# Patient Record
Sex: Male | Born: 1955
Health system: Southern US, Community
[De-identification: ages and names within clinical notes are randomized; demographics above are authoritative.]

## PROBLEM LIST (undated history)

## (undated) DIAGNOSIS — T7840XA Allergy, unspecified, initial encounter: Secondary | ICD-10-CM

## (undated) DIAGNOSIS — G90519 Complex regional pain syndrome I of unspecified upper limb: Secondary | ICD-10-CM

## (undated) HISTORY — DX: Allergy, unspecified, initial encounter: T78.40XA

## (undated) HISTORY — DX: Complex regional pain syndrome I of unspecified upper limb: G90.519

---
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
## 1996-11-16 HISTORY — PX: KNEE SURGERY: SHX244

## 2001-11-16 HISTORY — PX: CARDIAC CATHETERIZATION: SHX172

## 2002-03-27 ENCOUNTER — Inpatient Hospital Stay (HOSPITAL_COMMUNITY): Admission: EM | Admit: 2002-03-27 | Discharge: 2002-03-28 | Payer: Self-pay | Admitting: Cardiology

## 2015-11-17 HISTORY — PX: SHOULDER ARTHROSCOPY WITH ROTATOR CUFF REPAIR: SHX5685

## 2016-04-24 ENCOUNTER — Other Ambulatory Visit (HOSPITAL_COMMUNITY): Payer: Self-pay | Admitting: Orthopedic Surgery

## 2016-04-24 DIAGNOSIS — G90511 Complex regional pain syndrome I of right upper limb: Secondary | ICD-10-CM

## 2016-04-30 ENCOUNTER — Encounter (HOSPITAL_COMMUNITY)
Admission: RE | Admit: 2016-04-30 | Discharge: 2016-04-30 | Disposition: A | Discharge status: Home / Self Care | Payer: BLUE CROSS/BLUE SHIELD | Source: Ambulatory Visit | Attending: Orthopedic Surgery | Admitting: Orthopedic Surgery

## 2016-04-30 DIAGNOSIS — G90511 Complex regional pain syndrome I of right upper limb: Secondary | ICD-10-CM | POA: Diagnosis present

## 2016-04-30 MED ORDER — TECHNETIUM TC 99M MEDRONATE IV KIT
26.5000 | PACK | Freq: Once | INTRAVENOUS | Status: AC | PRN
  Administered 2016-04-30: 26.5 via INTRAVENOUS

## 2017-05-19 IMAGING — NM NM BONE 3 PHASE
1 series · 6 of 6 positions shown · non-contrast
Comparison: None.

CLINICAL DATA: Complex regional pain syndrome

EXAM:
NUCLEAR MEDICINE 3-PHASE BONE SCAN
TECHNIQUE: Radionuclide angiographic images, immediate static blood pool
images, and 3-hour delayed static images were obtained of the upper
extremities after intravenous injection of radiopharmaceutical.
RADIOPHARMACEUTICALS:  26.5 mCi Rc-WWm MDP

[bf bone flow · 10.03mm/px · 6 of 48 frames shown]
[frame 5/48]
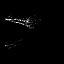
[frame 13/48]
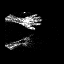
[frame 21/48  full-range]
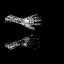
[frame 29/48  full-range]
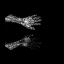
[frame 37/48  full-range]
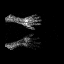
[frame 45/48  full-range]
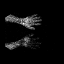

[6 of 6 positions shown; findings below may reference images not displayed]

FINDINGS: Vascular phase: Asymmetric increased flow to the right hand and
wrist identified.

Blood pool phase: Asymmetric increased blood pool activity to the
right hand and wrist noted.

Delayed phase: Asymmetric increased radiotracer uptake to the right
hand and wrist identified which follows a periarticular
distribution.
IMPRESSION: 1. There is abnormal asymmetric increased radiotracer uptake to the
right wrist and hand which on the delayed phase images exhibits a
periarticular distribution. Findings are compatible with reflex
sympathetic dystrophy to the right upper extremity.

## 2017-08-18 DIAGNOSIS — M792 Neuralgia and neuritis, unspecified: Secondary | ICD-10-CM | POA: Insufficient documentation

## 2017-08-18 DIAGNOSIS — Z9889 Other specified postprocedural states: Secondary | ICD-10-CM | POA: Insufficient documentation

## 2017-08-18 DIAGNOSIS — G5641 Causalgia of right upper limb: Secondary | ICD-10-CM | POA: Insufficient documentation

## 2017-08-23 DIAGNOSIS — R040 Epistaxis: Secondary | ICD-10-CM | POA: Insufficient documentation

## 2017-09-16 ENCOUNTER — Ambulatory Visit (INDEPENDENT_AMBULATORY_CARE_PROVIDER_SITE_OTHER): Payer: BLUE CROSS/BLUE SHIELD | Admitting: Otolaryngology

## 2017-09-16 DIAGNOSIS — R040 Epistaxis: Secondary | ICD-10-CM

## 2017-10-14 ENCOUNTER — Ambulatory Visit (INDEPENDENT_AMBULATORY_CARE_PROVIDER_SITE_OTHER): Payer: BLUE CROSS/BLUE SHIELD | Admitting: Otolaryngology

## 2017-10-14 DIAGNOSIS — H6121 Impacted cerumen, right ear: Secondary | ICD-10-CM | POA: Diagnosis not present

## 2017-10-14 DIAGNOSIS — R040 Epistaxis: Secondary | ICD-10-CM

## 2017-12-13 ENCOUNTER — Ambulatory Visit: Payer: Self-pay | Admitting: Family Medicine

## 2017-12-14 ENCOUNTER — Ambulatory Visit (INDEPENDENT_AMBULATORY_CARE_PROVIDER_SITE_OTHER): Payer: BLUE CROSS/BLUE SHIELD | Admitting: Family Medicine

## 2017-12-14 ENCOUNTER — Encounter: Payer: Self-pay | Admitting: Family Medicine

## 2017-12-14 VITALS — BP 114/64 | HR 71 | Ht 71.0 in | Wt 172.2 lb

## 2017-12-14 DIAGNOSIS — Z0000 Encounter for general adult medical examination without abnormal findings: Secondary | ICD-10-CM | POA: Diagnosis not present

## 2017-12-14 NOTE — Progress Notes (Signed)
Subjective:  Patient ID: Steven Cummings, male    DOB: 06/26/1956  Age: 62 y.o. MRN: [id number]  CC: New Patient (Initial Visit) (pt here today to establish care)   HPI Steven Cummings presents for complete physical examination.  He says he stays very active even though he has been retired for 4 years.  He works for 36 years with [employer].  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  He prefers to stay busy.  He never sits down.  His only medical concern is complex regional pain syndrome of the right upper extremity which has come down since taking amitriptyline.  He is under the care of the orthopedist who did his right shoulder arthroscopy to 3 years ago.  Depression screen PHQ 2/9 12/14/2017  Decreased Interest 0  Down, Depressed, Hopeless 0  PHQ - 2 Score 0    History Steven Cummings has a past medical history of Allergy and CRPS (complex regional pain syndrome), upper limb.   He has a past surgical history that includes Cardiac catheterization (2003); Tonsilectomy/adenoidectomy with myringotomy (1960); Knee surgery (Left, 1998); and Shoulder arthroscopy with rotator cuff repair (Right, 2017).   His family history includes Arthritis in his maternal grandfather, maternal grandmother, paternal grandfather, and paternal grandmother; Heart disease in his father and paternal grandfather; Hyperlipidemia in his father and mother; Hypertension in his father and mother; Kidney disease in his father; Stroke in his father.He reports that he quit smoking about 32 years ago. His smoking use included cigarettes. He has a 14.00 pack-year smoking history. he has never used smokeless tobacco. He reports that he drinks about 1.8 oz of alcohol per week. He reports that he does not use drugs.    ROS Review of Systems  Constitutional: Negative for activity change, appetite change, chills, diaphoresis, fatigue, fever and  unexpected weight change.  HENT: Negative for congestion, ear pain, hearing loss, postnasal drip, rhinorrhea, sore throat, tinnitus and trouble swallowing.   Eyes: Negative for photophobia, pain, discharge, redness and visual disturbance.  Respiratory: Negative for apnea, cough, choking, chest tightness, shortness of breath, wheezing and stridor.   Cardiovascular: Negative for chest pain, palpitations and leg swelling.  Gastrointestinal: Negative for abdominal distention, abdominal pain, blood in stool, constipation, diarrhea, nausea and vomiting.  Endocrine: Negative for cold intolerance, heat intolerance, polydipsia, polyphagia and polyuria.  Genitourinary: Negative for difficulty urinating, dysuria, enuresis, flank pain, frequency, genital sores, hematuria and urgency.  Musculoskeletal: Negative for arthralgias and joint swelling.  Skin: Negative for color change, rash and wound.  Allergic/Immunologic: Negative for immunocompromised state.  Neurological: Negative for dizziness, tremors, seizures, syncope, facial asymmetry, speech difficulty, weakness, light-headedness, numbness and headaches.  Hematological: Does not bruise/bleed easily.  Psychiatric/Behavioral: Negative for agitation, behavioral problems, confusion, decreased concentration, dysphoric mood, hallucinations, sleep disturbance and suicidal ideas. The patient is not nervous/anxious and is not hyperactive.     Objective:  BP 114/64   Pulse 71   Ht _0  (1.803 m)   Wt 172 lb 4 oz (78.1 kg)   BMI 24.02 kg/m   BP Readings from Last 3 Encounters:  12/14/17 114/64    Wt Readings from Last 3 Encounters:  12/14/17 172 lb 4 oz (78.1 kg)     Physical Exam  Constitutional: He is oriented to person, place, and time. He appears well-developed and well-nourished. No distress.  HENT:  Head: Normocephalic and atraumatic.  Right Ear: External ear normal.  Left  Ear: External ear normal.  Nose: Nose normal.  Mouth/Throat:  Oropharynx is clear and moist.  Eyes: Conjunctivae and EOM are normal. Pupils are equal, round, and reactive to light.  Neck: Normal range of motion. Neck supple. No tracheal deviation present. No thyromegaly present.  Cardiovascular: Normal rate, regular rhythm and normal heart sounds. Exam reveals no gallop and no friction rub.  No murmur heard. Pulmonary/Chest: Effort normal and breath sounds normal. No respiratory distress. He has no wheezes. He has no rales.  Abdominal: Soft. Bowel sounds are normal. He exhibits no distension and no mass. There is no tenderness.  Musculoskeletal: Normal range of motion. He exhibits no edema.  Lymphadenopathy:    He has no cervical adenopathy.  Neurological: He is alert and oriented to person, place, and time. He has normal reflexes.  Skin: Skin is warm and dry.  Psychiatric: He has a normal mood and affect. His behavior is normal. Judgment and thought content normal.      Assessment & Plan:   Chief was seen today for new patient (initial visit).  Diagnoses and all orders for this visit:  Well adult exam -     CBC with Differential/Platelet -     CMP14+EGFR -     Lipid panel -     PSA, total and free -     VITAMIN D 25 Hydroxy (Vit-D Deficiency, Fractures) -     Hepatitis C antibody -     HIV antibody       I am having Steven Cummings maintain his amitriptyline, multivitamin with minerals, and aspirin EC.  Allergies as of 12/14/2017      Reactions   Sulfa Antibiotics Rash      Medication List        Accurate as of 12/14/17  9:40 PM. Always use your most recent med list.          amitriptyline 25 MG tablet Commonly known as:  ELAVIL Take 25 mg by mouth at bedtime.   aspirin EC 81 MG tablet Take 81 mg by mouth.   multivitamin with minerals tablet Take by mouth.        Follow-up: No Follow-up on file.  Claretta Fraise, M.D.

## 2017-12-15 LAB — CBC WITH DIFFERENTIAL/PLATELET
BASOS ABS: 0 10*3/uL (ref 0.0–0.2)
Basos: 1 %
EOS (ABSOLUTE): 0.2 10*3/uL (ref 0.0–0.4)
EOS: 4 %
HEMATOCRIT: 40.9 % (ref 37.5–51.0)
HEMOGLOBIN: 13.9 g/dL (ref 13.0–17.7)
IMMATURE GRANULOCYTES: 0 %
Immature Grans (Abs): 0 10*3/uL (ref 0.0–0.1)
LYMPHS ABS: 1.6 10*3/uL (ref 0.7–3.1)
LYMPHS: 40 %
MCH: 31.3 pg (ref 26.6–33.0)
MCHC: 34 g/dL (ref 31.5–35.7)
MCV: 92 fL (ref 79–97)
Monocytes Absolute: 0.3 10*3/uL (ref 0.1–0.9)
Monocytes: 9 %
NEUTROS PCT: 46 %
Neutrophils Absolute: 1.9 10*3/uL (ref 1.4–7.0)
Platelets: 197 10*3/uL (ref 150–379)
RBC: 4.44 x10E6/uL (ref 4.14–5.80)
RDW: 13.2 % (ref 12.3–15.4)
WBC: 4 10*3/uL (ref 3.4–10.8)

## 2017-12-15 LAB — CMP14+EGFR
ALK PHOS: 112 IU/L (ref 39–117)
ALT: 13 IU/L (ref 0–44)
AST: 17 IU/L (ref 0–40)
Albumin/Globulin Ratio: 1.9 (ref 1.2–2.2)
Albumin: 4.7 g/dL (ref 3.6–4.8)
BUN/Creatinine Ratio: 17 (ref 10–24)
BUN: 14 mg/dL (ref 8–27)
Bilirubin Total: 0.7 mg/dL (ref 0.0–1.2)
CALCIUM: 9.4 mg/dL (ref 8.6–10.2)
CO2: 23 mmol/L (ref 20–29)
CREATININE: 0.81 mg/dL (ref 0.76–1.27)
Chloride: 99 mmol/L (ref 96–106)
GFR calc Af Amer: 110 mL/min/{1.73_m2} (ref >59)
GFR, EST NON AFRICAN AMERICAN: 95 mL/min/{1.73_m2} (ref >59)
GLUCOSE: 100 mg/dL — AB (ref 65–99)
Globulin, Total: 2.5 g/dL (ref 1.5–4.5)
Potassium: 3.9 mmol/L (ref 3.5–5.2)
Sodium: 139 mmol/L (ref 134–144)
Total Protein: 7.2 g/dL (ref 6.0–8.5)

## 2017-12-15 LAB — LIPID PANEL
CHOL/HDL RATIO: 4.2 ratio (ref 0.0–5.0)
CHOLESTEROL TOTAL: 173 mg/dL (ref 100–199)
HDL: 41 mg/dL (ref >39)
LDL CALC: 106 mg/dL — AB (ref 0–99)
TRIGLYCERIDES: 132 mg/dL (ref 0–149)
VLDL CHOLESTEROL CAL: 26 mg/dL (ref 5–40)

## 2017-12-15 LAB — HEPATITIS C ANTIBODY

## 2017-12-15 LAB — PSA, TOTAL AND FREE
PSA, Free Pct: 22.2 %
PSA, Free: 0.2 ng/mL
Prostate Specific Ag, Serum: 0.9 ng/mL (ref 0.0–4.0)

## 2017-12-15 LAB — VITAMIN D 25 HYDROXY (VIT D DEFICIENCY, FRACTURES): VIT D 25 HYDROXY: 34.1 ng/mL (ref 30.0–100.0)

## 2017-12-15 LAB — HIV ANTIBODY (ROUTINE TESTING W REFLEX): HIV SCREEN 4TH GENERATION: NONREACTIVE

## 2017-12-20 DIAGNOSIS — M79644 Pain in right finger(s): Secondary | ICD-10-CM | POA: Insufficient documentation

## 2017-12-20 DIAGNOSIS — M25641 Stiffness of right hand, not elsewhere classified: Secondary | ICD-10-CM | POA: Insufficient documentation

## 2018-01-03 ENCOUNTER — Encounter: Payer: Self-pay | Admitting: Family Medicine

## 2018-01-03 ENCOUNTER — Ambulatory Visit (INDEPENDENT_AMBULATORY_CARE_PROVIDER_SITE_OTHER): Payer: BLUE CROSS/BLUE SHIELD | Admitting: Family Medicine

## 2018-01-03 VITALS — BP 107/61 | HR 77 | Temp 97.4°F | Ht 71.0 in | Wt 171.0 lb

## 2018-01-03 DIAGNOSIS — R590 Localized enlarged lymph nodes: Secondary | ICD-10-CM | POA: Diagnosis not present

## 2018-01-03 NOTE — Progress Notes (Signed)
BP 107/61   Pulse 77   Temp (!) 97.4 F (36.3 C) (Oral)   Ht 5\' 11"  (1.803 m)   Wt 171 lb (77.6 kg)   BMI 23.85 kg/m    Subjective:    Patient ID: Steven Cummings, male    DOB: 09/15/1956, 62 y.o.   MRN: [id number]  HPI: Steven Ligasimothy A Ogden is a 62 y.o. male presenting on 01/03/2018 for Swelling on right side of neck   HPI Pt presents with c/o right sided neck swelling beginning 10 days ago. States it has subsided over weekend and tends to come and go. He describes it as a nagging pain similar to a sore throat on the right side. States the pain ran up to ear. Denies any cough, congestion, fever or URI sx prior to neck swelling. Also denies any trauma, erythema, or rash to area. Pt is UTD on influenza vaccination. No one at home is ill, but has been taking his parents to many doctor's appointments, so he feels he has likely been exposed to many illnesses. Today he feels the swelling has subsided almost completely. He has still been able to be very active, running/walking 5-10 miles daily.  Patient has not taken anything to improve it but it just imis improved on its own.  Relevant past medical, surgical, family and social history reviewed and updated as indicated. Interim medical history since our last visit reviewed. Allergies and medications reviewed and updated.  Review of Systems  Constitutional: Negative for activity change, fatigue and fever.  HENT: Negative for congestion, dental problem, ear pain, rhinorrhea, sinus pain and trouble swallowing.   Respiratory: Negative for cough and chest tightness.   Skin: Negative for rash.      Per HPI unless specifically indicated above     Objective:    BP 107/61   Pulse 77   Temp (!) 97.4 F (36.3 C) (Oral)   Ht 5\' 11"  (1.803 m)   Wt 171 lb (77.6 kg)   BMI 23.85 kg/m   Wt Readings from Last 3 Encounters:  01/03/18 171 lb (77.6 kg)  12/14/17 172 lb 4 oz (78.1 kg)    Physical Exam  Constitutional: He is oriented to  person, place, and time. He appears well-developed and well-nourished.  HENT:  Right Ear: Tympanic membrane, external ear and ear canal normal.  Left Ear: Tympanic membrane, external ear and ear canal normal.  Nose: Right sinus exhibits no maxillary sinus tenderness and no frontal sinus tenderness. Left sinus exhibits no maxillary sinus tenderness and no frontal sinus tenderness.  Mouth/Throat: Uvula is midline and oropharynx is clear and moist.  Cardiovascular: Normal rate, regular rhythm and normal heart sounds.  Pulmonary/Chest: Effort normal and breath sounds normal. He has no wheezes. He has no rales.  Lymphadenopathy:       Head (right side): No submental, no submandibular and no tonsillar adenopathy present.       Head (left side): No submental, no submandibular and no tonsillar adenopathy present.    He has no cervical adenopathy.       Right: No supraclavicular adenopathy present.       Left: No supraclavicular adenopathy present.  Likely had right sided anterior cervical lymphadenopathy. Not palpable today.  Neurological: He is alert and oriented to person, place, and time.        Assessment & Plan:   Problem List Items Addressed This Visit    None    Visit Diagnoses    LAD (lymphadenopathy), anterior  cervical    -  Primary   Likely had swollen right anterior cervical lymph node, not palpable today and has resolved, continue to monitor       Follow up plan: Return if symptoms worsen or fail to improve.  Counseling provided for all of the vaccine components No orders of the defined types were placed in this encounter.   Patient seen and examined with Franco Collet, PA student. Agree with assessment and plan. Arville Care, MD [facility] 01/03/2018, 9:07 AM

## 2018-06-15 ENCOUNTER — Ambulatory Visit (INDEPENDENT_AMBULATORY_CARE_PROVIDER_SITE_OTHER): Payer: BLUE CROSS/BLUE SHIELD | Admitting: Family

## 2018-06-15 ENCOUNTER — Encounter: Payer: Self-pay | Admitting: Family

## 2018-06-15 VITALS — BP 114/65 | HR 56 | Temp 96.6°F | Wt 173.4 lb

## 2018-06-15 DIAGNOSIS — J309 Allergic rhinitis, unspecified: Secondary | ICD-10-CM | POA: Diagnosis not present

## 2018-06-15 DIAGNOSIS — R591 Generalized enlarged lymph nodes: Secondary | ICD-10-CM | POA: Diagnosis not present

## 2018-06-15 DIAGNOSIS — Z87891 Personal history of nicotine dependence: Secondary | ICD-10-CM

## 2018-06-15 DIAGNOSIS — M542 Cervicalgia: Secondary | ICD-10-CM | POA: Diagnosis not present

## 2018-06-15 MED ORDER — CETIRIZINE HCL 10 MG PO TABS: 10.0000 mg | ORAL_TABLET | Freq: Every day | ORAL | 11 refills | Status: DC

## 2018-06-15 MED ORDER — FLUTICASONE PROPIONATE 50 MCG/ACT NA SUSP: 2.0000 | Freq: Every day | NASAL | 6 refills | Status: DC

## 2018-06-15 NOTE — Progress Notes (Signed)
   Subjective:    Patient ID: Steven Cummings, male    DOB: 10/11/1956, 62 y.o.   MRN: [id number]  Chief Complaint  Patient presents with  . Neck Pain    HPI PT presents to the office today with recurrent intermittent aching right sided throat pain of a 2 out 10. States he was seen in the office in February and was told it could be related to allergies.   States he currently does not smoke or use tobacco, but states he smoked for 14 years. He quit approx 30 years ago.   Denies any trouble swallowing, decrease appetite, fever, sore throat or SOB. States he feels like his right side is slightly more swollen than his left.      Review of Systems  HENT: Negative for ear pain and sore throat.   All other systems reviewed and are negative.      Objective:   Physical Exam  Constitutional: He is oriented to person, place, and time. He appears well-developed and well-nourished. No distress.  HENT:  Head: Normocephalic.  Right Ear: External ear normal.  Left Ear: External ear normal.  Nose: Mucosal edema present.  Mouth/Throat: Oropharynx is clear and moist.  Eyes: Pupils are equal, round, and reactive to light. Right eye exhibits no discharge. Left eye exhibits no discharge.  Neck: Normal range of motion. Neck supple. No thyromegaly present.  Cardiovascular: Normal rate, regular rhythm, normal heart sounds and intact distal pulses.  No murmur heard. Pulmonary/Chest: Effort normal and breath sounds normal. No respiratory distress. He has no wheezes.  Abdominal: Soft. Bowel sounds are normal. He exhibits no distension. There is no tenderness.  Musculoskeletal: Normal range of motion. He exhibits no edema or tenderness.  Lymphadenopathy:    He has no cervical adenopathy.  Neurological: He is alert and oriented to person, place, and time. He has normal reflexes. No cranial nerve deficit.  Skin: Skin is warm and dry. No rash noted. No erythema.  Psychiatric: He has a normal mood and  affect. His behavior is normal. Judgment and thought content normal.  Vitals reviewed.     BP 114/65   Pulse (!) 56   Temp (!) 96.6 F (35.9 C) (Oral)   Wt 173 lb 6.4 oz (78.7 kg)   BMI 24.18 kg/m      Assessment & Plan:  Steven Ligasimothy A Partain comes in today with chief complaint of Neck Pain   Diagnosis and orders addressed:  1. Lymphadenopathy - US Soft Tissue Head/Neck; Future  2. Anterior neck pain - US Soft Tissue Head/Neck; Future  3. History of smoking - US Soft Tissue Head/Neck; Future  4. Allergic rhinitis, unspecified seasonality, unspecified trigger - fluticasone (FLONASE) 50 MCG/ACT nasal spray; Place 2 sprays into both nostrils daily.  Dispense: 16 g; Refill: 6 - cetirizine (ZYRTEC) 10 MG tablet; Take 1 tablet (10 mg total) by mouth daily.  Dispense: 30 tablet; Refill: 11   We will do US to rule out, I do believe this is related to allergies since nose and throat erythemas.   Follow up plan: Keep follow up with PCP   Jannifer Rodneyhristy Austin Herd, FNP

## 2018-06-15 NOTE — Patient Instructions (Signed)
Lymphadenopathy Lymphadenopathy refers to swollen or enlarged lymph glands, also called lymph nodes. Lymph glands are part of your body's defense (immune) system, which protects the body from infections, germs, and diseases. Lymph glands are found in many locations in your body, including the neck, underarm, and groin. Many things can cause lymph glands to become enlarged. When your immune system responds to germs, such as viruses or bacteria, infection-fighting cells and fluid build up. This causes the glands to grow in size. Usually, this is not something to worry about. The swelling and any soreness often go away without treatment. However, swollen lymph glands can also be caused by a number of diseases. Your health care provider may do various tests to help determine the cause. If the cause of your swollen lymph glands cannot be found, it is important to monitor your condition to make sure the swelling goes away. Follow these instructions at home: Watch your condition for any changes. The following actions may help to lessen any discomfort you are feeling:  Get plenty of rest.  Take medicines only as directed by your health care provider. Your health care provider may recommend over-the-counter medicines for pain.  Apply moist heat compresses to the site of swollen lymph nodes as directed by your health care provider. This can help reduce any pain.  Check your lymph nodes daily for any changes.  Keep all follow-up visits as directed by your health care provider. This is important.  Contact a health care provider if:  Your lymph nodes are still swollen after 2 weeks.  Your swelling increases or spreads to other areas.  Your lymph nodes are hard, seem fixed to the skin, or are growing rapidly.  Your skin over the lymph nodes is red and inflamed.  You have a fever.  You have chills.  You have fatigue.  You develop a sore throat.  You have abdominal pain.  You have weight  loss.  You have night sweats. Get help right away if:  You notice fluid leaking from the area of the enlarged lymph node.  You have severe pain in any area of your body.  You have chest pain.  You have shortness of breath. This information is not intended to replace advice given to you by your health care provider. Make sure you discuss any questions you have with your health care provider. Document Released: 08/11/2008 Document Revised: 04/09/2016 Document Reviewed: 06/07/2014 Elsevier Interactive Patient Education  2018 [employer]  

## 2018-06-20 ENCOUNTER — Ambulatory Visit (HOSPITAL_COMMUNITY)
Admission: RE | Admit: 2018-06-20 | Discharge: 2018-06-20 | Disposition: A | Discharge status: Home / Self Care | Payer: BLUE CROSS/BLUE SHIELD | Source: Ambulatory Visit | Attending: Family | Admitting: Family

## 2018-06-20 DIAGNOSIS — R591 Generalized enlarged lymph nodes: Secondary | ICD-10-CM | POA: Diagnosis not present

## 2018-06-20 DIAGNOSIS — M542 Cervicalgia: Secondary | ICD-10-CM

## 2018-06-20 DIAGNOSIS — Z87891 Personal history of nicotine dependence: Secondary | ICD-10-CM | POA: Insufficient documentation

## 2018-06-22 ENCOUNTER — Ambulatory Visit: Payer: BLUE CROSS/BLUE SHIELD | Admitting: Family Medicine

## 2018-06-29 ENCOUNTER — Ambulatory Visit: Payer: BLUE CROSS/BLUE SHIELD | Admitting: Family Medicine

## 2018-08-15 ENCOUNTER — Ambulatory Visit (INDEPENDENT_AMBULATORY_CARE_PROVIDER_SITE_OTHER): Payer: BLUE CROSS/BLUE SHIELD

## 2018-08-15 DIAGNOSIS — Z23 Encounter for immunization: Secondary | ICD-10-CM

## 2018-08-17 NOTE — Progress Notes (Signed)
Flu shot administered.

## 2018-10-12 ENCOUNTER — Encounter: Payer: Self-pay | Admitting: Family Medicine

## 2018-10-12 ENCOUNTER — Ambulatory Visit (INDEPENDENT_AMBULATORY_CARE_PROVIDER_SITE_OTHER): Payer: BLUE CROSS/BLUE SHIELD | Admitting: Family Medicine

## 2018-10-12 VITALS — BP 100/59 | HR 70 | Temp 97.8°F | Ht 71.0 in | Wt 175.0 lb

## 2018-10-12 DIAGNOSIS — M792 Neuralgia and neuritis, unspecified: Secondary | ICD-10-CM

## 2018-10-12 MED ORDER — BETAMETHASONE SOD PHOS & ACET 6 (3-3) MG/ML IJ SUSP
6.0000 mg | Freq: Once | INTRAMUSCULAR | Status: AC
  Administered 2018-10-12: 6 mg via INTRAMUSCULAR

## 2018-10-16 ENCOUNTER — Encounter: Payer: Self-pay | Admitting: Family Medicine

## 2018-10-16 NOTE — Progress Notes (Signed)
Chief Complaint  Patient presents with  . Neck Pain    HPI  Patient presents today for ongoing neck pain intermittently for several months. Radiating to the right shoulder. Noweakness or numbness. Pain is a moderate dull ache 4-6/10.  PMH: Smoking status noted ROS: Per HPI  Objective: BP (!) 100/59   Pulse 70   Temp 97.8 F (36.6 C) (Oral)   Ht 5\' 11"  (1.803 m)   Wt 175 lb (79.4 kg)   BMI 24.41 kg/m  Gen: NAD, alert, cooperative with exam HEENT: NCAT, EOMI, PERRL CV: RRR, good S1/S2, no murmur Ext: No edema, warm. The right shoulder has FROM. Painful with full abduction. NV grossly intact, RUE Neuro: Alert and oriented, No gross deficits  Assessment and plan:  1. Neuralgia and neuritis, unspecified     Meds ordered this encounter  Medications  . betamethasone acetate-betamethasone sodium phosphate (CELESTONE) injection 6 mg      Follow up as needed.  Mechele ClaudeWarren Lakeasha Petion, MD

## 2018-10-25 ENCOUNTER — Telehealth: Payer: Self-pay | Admitting: Family Medicine

## 2018-10-25 NOTE — Telephone Encounter (Signed)
Pt aware and he states he is better so no need ortho.

## 2018-10-25 NOTE — Telephone Encounter (Signed)
Did you want pt to go to Ortho. He had no idea we were doing referral. Please advise.

## 2018-10-25 NOTE — Telephone Encounter (Signed)
Optional. Thought I spoke to him about it. If he is better, can cancel. Thanks, WS

## 2018-12-05 ENCOUNTER — Other Ambulatory Visit: Payer: Self-pay | Admitting: Family

## 2018-12-05 DIAGNOSIS — J309 Allergic rhinitis, unspecified: Secondary | ICD-10-CM

## 2018-12-14 ENCOUNTER — Encounter: Payer: Self-pay | Admitting: Family Medicine

## 2018-12-14 ENCOUNTER — Ambulatory Visit (INDEPENDENT_AMBULATORY_CARE_PROVIDER_SITE_OTHER): Payer: BLUE CROSS/BLUE SHIELD | Admitting: Family Medicine

## 2018-12-14 VITALS — BP 112/71 | HR 58 | Temp 97.0°F | Ht 71.0 in | Wt 178.4 lb

## 2018-12-14 DIAGNOSIS — Z0000 Encounter for general adult medical examination without abnormal findings: Secondary | ICD-10-CM

## 2018-12-14 DIAGNOSIS — Z125 Encounter for screening for malignant neoplasm of prostate: Secondary | ICD-10-CM

## 2018-12-14 LAB — URINALYSIS
Bilirubin, UA: NEGATIVE
Glucose, UA: NEGATIVE
Ketones, UA: NEGATIVE
LEUKOCYTES UA: NEGATIVE
Nitrite, UA: NEGATIVE
Protein, UA: NEGATIVE
RBC, UA: NEGATIVE
Specific Gravity, UA: 1.01 (ref 1.005–1.030)
Urobilinogen, Ur: 0.2 mg/dL (ref 0.2–1.0)
pH, UA: 7 (ref 5.0–7.5)

## 2018-12-14 NOTE — Progress Notes (Signed)
Subjective:  Patient ID: Steven Cummings, male    DOB: 07/24/1956  Age: 63 y.o. MRN: [id number]  CC: Annual Exam   HPI Steven Cummings presents for Annual physical. Exercising vigorously daily - gets 10-15000 steps every day. 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 Follows a careful diet - grills a lot. Avoids fried foods.  Depression screen [facility] 2/9 12/14/2018 10/12/2018 06/15/2018  Decreased Interest 0 0 0  Down, Depressed, Hopeless 0 0 0  PHQ - 2 Score 0 0 0  Altered sleeping - 0 -  Tired, decreased energy - 0 -  Change in appetite - 0 -  Feeling bad or failure about yourself  - 0 -  Trouble concentrating - 0 -  Moving slowly or fidgety/restless - 0 -  Suicidal thoughts - 0 -  PHQ-9 Score - 0 -  Difficult doing work/chores - Not difficult at all -    History Steven Cummings has a past medical history of Allergy and CRPS (complex regional pain syndrome), upper limb.   Steven Cummings has a past surgical history that includes Cardiac catheterization (2003); Tonsilectomy/adenoidectomy with myringotomy (1960); Knee surgery (Left, 1998); and Shoulder arthroscopy with rotator cuff repair (Right, 2017).   Steven Cummings family history includes Arthritis in Steven Cummings maternal grandfather, maternal grandmother, paternal grandfather, and paternal grandmother; Heart disease in Steven Cummings father and paternal grandfather; Hyperlipidemia in Steven Cummings father and mother; Hypertension in Steven Cummings father and mother; Kidney disease in Steven Cummings father; Stroke in Steven Cummings father.Steven Cummings reports that Steven Cummings quit smoking about 33 years ago. Steven Cummings smoking use included cigarettes. Steven Cummings has a 14.00 pack-year smoking history. Steven Cummings has never used smokeless tobacco. Steven Cummings reports current alcohol use of about 3.0 standard drinks of alcohol per week. Steven Cummings reports that Steven Cummings does not use drugs.    ROS Review of Systems  Constitutional: Negative for activity change, fatigue and unexpected weight change.  HENT: Negative for congestion, ear pain, hearing loss, postnasal drip and trouble  swallowing.   Eyes: Negative for pain and visual disturbance.  Respiratory: Negative for cough, chest tightness and shortness of breath.   Cardiovascular: Negative for chest pain, palpitations and leg swelling.  Gastrointestinal: Negative for abdominal distention, abdominal pain, blood in stool, constipation, diarrhea, nausea and vomiting.  Endocrine: Negative for cold intolerance, heat intolerance and polydipsia.  Genitourinary: Negative for difficulty urinating, dysuria, flank pain, frequency and urgency.  Musculoskeletal: Negative for arthralgias and joint swelling.  Skin: Negative for color change, rash and wound.  Neurological: Negative for dizziness, syncope, speech difficulty, weakness, light-headedness, numbness and headaches.  Hematological: Does not bruise/bleed easily.  Psychiatric/Behavioral: Negative for confusion, decreased concentration, dysphoric mood and sleep disturbance. The patient is not nervous/anxious.     Objective:  BP 112/71   Pulse (!) 58   Temp (!) 97 F (36.1 C) (Oral)   Ht _0  (1.803 m)   Wt 178 lb 6 oz (80.9 kg)   BMI 24.88 kg/m   BP Readings from Last 3 Encounters:  12/14/18 112/71  10/12/18 (!) 100/59  06/15/18 114/65    Wt Readings from Last 3 Encounters:  12/14/18 178 lb 6 oz (80.9 kg)  10/12/18 175 lb (79.4 kg)  06/15/18 173 lb 6.4 oz (78.7 kg)     Physical Exam Constitutional:      Appearance: Steven Cummings is well-developed.  HENT:     Head: Normocephalic and atraumatic.  Eyes:     Pupils: Pupils are equal, round, and reactive to light.  Neck:     Musculoskeletal: Normal range of motion.  Thyroid: No thyromegaly.     Trachea: No tracheal deviation.  Cardiovascular:     Rate and Rhythm: Normal rate and regular rhythm.     Heart sounds: Normal heart sounds. No murmur. No friction rub. No gallop.   Pulmonary:     Breath sounds: Normal breath sounds. No wheezing or rales.  Abdominal:     General: Bowel sounds are normal. There is no  distension.     Palpations: Abdomen is soft. There is no mass.     Tenderness: There is no abdominal tenderness.     Hernia: There is no hernia in the right inguinal area or left inguinal area.  Genitourinary:    Penis: Normal.      Scrotum/Testes: Normal.  Musculoskeletal: Normal range of motion.  Lymphadenopathy:     Cervical: No cervical adenopathy.  Skin:    General: Skin is warm and dry.  Neurological:     Mental Status: Steven Cummings is alert and oriented to person, place, and time.       Assessment & Plan:   Steven Cummings was seen today for annual exam.  Diagnoses and all orders for this visit:  Well adult exam -     CBC with Differential/Platelet -     CMP14+EGFR -     Lipid panel -     VITAMIN D 25 Hydroxy (Vit-D Deficiency, Fractures) -     Urinalysis  Special screening for malignant neoplasm of prostate -     PSA, total and free       I am having Steven A. Moncayo "[name]" maintain Steven Cummings multivitamin with minerals, aspirin EC, and fluticasone.  Allergies as of 12/14/2018      Reactions   Sulfa Antibiotics Rash      Medication List       Accurate as of December 14, 2018 10:28 AM. Always use your most recent med list.        aspirin EC 81 MG tablet Take 81 mg by mouth.   fluticasone 50 MCG/ACT nasal spray Commonly known as:  FLONASE SPRAY 2 SPRAYS INTO EACH NOSTRIL EVERY DAY   multivitamin with minerals tablet Take by mouth.        Follow-up: Return in about 1 year (around 12/15/2019) for Wellness.  Claretta Fraise, M.D.

## 2018-12-15 LAB — CBC WITH DIFFERENTIAL/PLATELET
BASOS: 1 %
Basophils Absolute: 0 10*3/uL (ref 0.0–0.2)
EOS (ABSOLUTE): 0.1 10*3/uL (ref 0.0–0.4)
EOS: 4 %
Hematocrit: 42.3 % (ref 37.5–51.0)
Hemoglobin: 14.6 g/dL (ref 13.0–17.7)
IMMATURE GRANS (ABS): 0 10*3/uL (ref 0.0–0.1)
Immature Granulocytes: 0 %
Lymphocytes Absolute: 1.2 10*3/uL (ref 0.7–3.1)
Lymphs: 32 %
MCH: 31.6 pg (ref 26.6–33.0)
MCHC: 34.5 g/dL (ref 31.5–35.7)
MCV: 92 fL (ref 79–97)
Monocytes Absolute: 0.4 10*3/uL (ref 0.1–0.9)
Monocytes: 12 %
Neutrophils Absolute: 1.9 10*3/uL (ref 1.4–7.0)
Neutrophils: 51 %
Platelets: 192 10*3/uL (ref 150–450)
RBC: 4.62 x10E6/uL (ref 4.14–5.80)
RDW: 12.2 % (ref 11.6–15.4)
WBC: 3.7 10*3/uL (ref 3.4–10.8)

## 2018-12-15 LAB — CMP14+EGFR
ALBUMIN: 4.9 g/dL — AB (ref 3.8–4.8)
ALT: 14 IU/L (ref 0–44)
AST: 20 IU/L (ref 0–40)
Albumin/Globulin Ratio: 2.2 (ref 1.2–2.2)
Alkaline Phosphatase: 110 IU/L (ref 39–117)
BUN/Creatinine Ratio: 15 (ref 10–24)
BUN: 14 mg/dL (ref 8–27)
Bilirubin Total: 1.3 mg/dL — ABNORMAL HIGH (ref 0.0–1.2)
CO2: 21 mmol/L (ref 20–29)
Calcium: 9.3 mg/dL (ref 8.6–10.2)
Chloride: 100 mmol/L (ref 96–106)
Creatinine, Ser: 0.91 mg/dL (ref 0.76–1.27)
GFR calc non Af Amer: 89 mL/min/{1.73_m2} (ref >59)
GFR, EST AFRICAN AMERICAN: 103 mL/min/{1.73_m2} (ref >59)
GLOBULIN, TOTAL: 2.2 g/dL (ref 1.5–4.5)
Glucose: 87 mg/dL (ref 65–99)
Potassium: 4.4 mmol/L (ref 3.5–5.2)
Sodium: 137 mmol/L (ref 134–144)
TOTAL PROTEIN: 7.1 g/dL (ref 6.0–8.5)

## 2018-12-15 LAB — LIPID PANEL
Chol/HDL Ratio: 3.5 ratio (ref 0.0–5.0)
Cholesterol, Total: 187 mg/dL (ref 100–199)
HDL: 54 mg/dL (ref >39)
LDL Calculated: 120 mg/dL — ABNORMAL HIGH (ref 0–99)
Triglycerides: 64 mg/dL (ref 0–149)
VLDL Cholesterol Cal: 13 mg/dL (ref 5–40)

## 2018-12-15 LAB — PSA, TOTAL AND FREE
PSA, Free Pct: 22.5 %
PSA, Free: 0.18 ng/mL
Prostate Specific Ag, Serum: 0.8 ng/mL (ref 0.0–4.0)

## 2018-12-15 LAB — VITAMIN D 25 HYDROXY (VIT D DEFICIENCY, FRACTURES): Vit D, 25-Hydroxy: 31.6 ng/mL (ref 30.0–100.0)

## 2018-12-16 NOTE — Progress Notes (Signed)
Hello Glade,  Your lab result is normal.Some minor variations that are not significant are commonly marked abnormal, but do not represent any medical problem for you.  Best regards, Shanautica Forker, M.D.

## 2019-06-01 ENCOUNTER — Other Ambulatory Visit: Payer: Self-pay | Admitting: Family

## 2019-06-01 DIAGNOSIS — J309 Allergic rhinitis, unspecified: Secondary | ICD-10-CM

## 2019-07-11 ENCOUNTER — Encounter: Payer: Self-pay | Admitting: *Deleted

## 2019-08-01 ENCOUNTER — Ambulatory Visit: Payer: BLUE CROSS/BLUE SHIELD

## 2019-08-10 ENCOUNTER — Ambulatory Visit (INDEPENDENT_AMBULATORY_CARE_PROVIDER_SITE_OTHER): Payer: BC Managed Care – PPO

## 2019-08-10 DIAGNOSIS — Z23 Encounter for immunization: Secondary | ICD-10-CM | POA: Diagnosis not present

## 2019-12-13 ENCOUNTER — Encounter: Payer: BLUE CROSS/BLUE SHIELD | Admitting: Family Medicine

## 2019-12-15 ENCOUNTER — Encounter: Payer: BLUE CROSS/BLUE SHIELD | Admitting: Family Medicine

## 2019-12-18 ENCOUNTER — Encounter: Payer: BLUE CROSS/BLUE SHIELD | Admitting: Family Medicine

## 2019-12-20 ENCOUNTER — Other Ambulatory Visit: Payer: Self-pay

## 2019-12-20 ENCOUNTER — Ambulatory Visit (INDEPENDENT_AMBULATORY_CARE_PROVIDER_SITE_OTHER): Payer: BC Managed Care – PPO | Admitting: Family Medicine

## 2019-12-20 ENCOUNTER — Encounter: Payer: Self-pay | Admitting: Family Medicine

## 2019-12-20 VITALS — BP 106/60 | HR 64 | Temp 96.8°F | Ht 71.0 in | Wt 179.0 lb

## 2019-12-20 DIAGNOSIS — Z0000 Encounter for general adult medical examination without abnormal findings: Secondary | ICD-10-CM

## 2019-12-20 DIAGNOSIS — Z13220 Encounter for screening for lipoid disorders: Secondary | ICD-10-CM | POA: Diagnosis not present

## 2019-12-20 DIAGNOSIS — E559 Vitamin D deficiency, unspecified: Secondary | ICD-10-CM

## 2019-12-20 DIAGNOSIS — Z125 Encounter for screening for malignant neoplasm of prostate: Secondary | ICD-10-CM | POA: Diagnosis not present

## 2019-12-20 NOTE — Progress Notes (Signed)
 Subjective:  Patient ID: Steven Cummings, male    DOB: 06/18/1956  Age: 64 y.o. MRN: [id number]  CC: Annual Exam   HPI Steven Cummings presents for CPE  Depression screen PHQ 2/9 12/20/2019 12/14/2018 10/12/2018  Decreased Interest 0 0 0  Down, Depressed, Hopeless 0 0 0  PHQ - 2 Score 0 0 0  Altered sleeping - - 0  Tired, decreased energy - - 0  Change in appetite - - 0  Feeling bad or failure about yourself  - - 0  Trouble concentrating - - 0  Moving slowly or fidgety/restless - - 0  Suicidal thoughts - - 0  PHQ-9 Score - - 0  Difficult doing work/chores - - Not difficult at all    History [name] has a past medical history of Allergy and CRPS (complex regional pain syndrome), upper limb.   He has a past surgical history that includes Cardiac catheterization (2003); Tonsilectomy/adenoidectomy with myringotomy (1960); Knee surgery (Left, 1998); and Shoulder arthroscopy with rotator cuff repair (Right, 2017).   His family history includes Arthritis in his maternal grandfather, maternal grandmother, paternal grandfather, and paternal grandmother; Heart disease in his father and paternal grandfather; Hyperlipidemia in his father and mother; Hypertension in his father and mother; Kidney disease in his father; Stroke in his father.He reports that he quit smoking about 34 years ago. His smoking use included cigarettes. He has a 14.00 pack-year smoking history. He has never used smokeless tobacco. He reports current alcohol use of about 3.0 standard drinks of alcohol per week. He reports that he does not use drugs.    ROS Review of Systems  Constitutional: Negative for activity change, chills, diaphoresis, fatigue, fever and unexpected weight change.  HENT: Negative for congestion, ear discharge, ear pain, hearing loss, nosebleeds, postnasal drip, sore throat, tinnitus and trouble swallowing.   Eyes: Negative for photophobia, pain, discharge, redness and visual disturbance.   Respiratory: Negative for cough, chest tightness, shortness of breath and wheezing.   Cardiovascular: Negative for chest pain, palpitations and leg swelling.  Gastrointestinal: Negative for abdominal distention, abdominal pain, blood in stool, constipation, diarrhea, nausea and vomiting.  Endocrine: Negative for cold intolerance, heat intolerance and polydipsia.  Genitourinary: Negative for difficulty urinating, dysuria, flank pain, frequency, hematuria and urgency.  Musculoskeletal: Negative for arthralgias, back pain, joint swelling, myalgias and neck pain.  Skin: Negative for color change, rash and wound.  Allergic/Immunologic: Negative for environmental allergies.  Neurological: Negative for dizziness, tremors, seizures, syncope, speech difficulty, weakness, light-headedness, numbness and headaches.  Hematological: Does not bruise/bleed easily.  Psychiatric/Behavioral: Negative for confusion, decreased concentration, dysphoric mood, hallucinations, sleep disturbance and suicidal ideas. The patient is not nervous/anxious.     Objective:  BP 106/60   Pulse 64   Temp (!) 96.8 F (36 C) (Temporal)   Ht 5' 11" (1.803 m)   Wt 179 lb (81.2 kg)   BMI 24.97 kg/m   BP Readings from Last 3 Encounters:  12/20/19 106/60  12/14/18 112/71  10/12/18 (!) 100/59    Wt Readings from Last 3 Encounters:  12/20/19 179 lb (81.2 kg)  12/14/18 178 lb 6 oz (80.9 kg)  10/12/18 175 lb (79.4 kg)     Physical Exam Constitutional:      Appearance: He is well-developed.  HENT:     Head: Normocephalic and atraumatic.  Eyes:     Pupils: Pupils are equal, round, and reactive to light.  Neck:     Thyroid: No thyromegaly.       Trachea: No tracheal deviation.  Cardiovascular:     Rate and Rhythm: Normal rate and regular rhythm.     Heart sounds: Normal heart sounds. No murmur. No friction rub. No gallop.   Pulmonary:     Breath sounds: Normal breath sounds. No wheezing or rales.  Abdominal:      General: Bowel sounds are normal. There is no distension.     Palpations: Abdomen is soft. There is no mass.     Tenderness: There is no abdominal tenderness.     Hernia: There is no hernia in the left inguinal area.  Genitourinary:    Penis: Normal.      Testes: Normal.  Musculoskeletal:        General: Normal range of motion.     Cervical back: Normal range of motion.  Lymphadenopathy:     Cervical: No cervical adenopathy.  Skin:    General: Skin is warm and dry.  Neurological:     Mental Status: He is alert and oriented to person, place, and time.       Assessment & Plan:   Jamesyn was seen today for annual exam.  Diagnoses and all orders for this visit:  Well adult exam -     PSA Total (Reflex To Free) -     Lipid panel -     CBC with Differential/Platelet -     CMP14+EGFR -     Urinalysis  Screening for prostate cancer -     PSA Total (Reflex To Free)  Vitamin D deficiency -     CBC with Differential/Platelet -     CMP14+EGFR -     VITAMIN D 25 Hydroxy (Vit-D Deficiency, Fractures)  Screening for lipid disorders -     Lipid panel       I am having Steven Cummings "[name]" maintain his multivitamin with minerals, aspirin EC, and fluticasone.  Allergies as of 12/20/2019      Reactions   Sulfa Antibiotics Rash      Medication List       Accurate as of December 20, 2019 11:35 AM. If you have any questions, ask your nurse or doctor.        aspirin EC 81 MG tablet Take 81 mg by mouth.   fluticasone 50 MCG/ACT nasal spray Commonly known as: FLONASE SPRAY 2 SPRAYS INTO EACH NOSTRIL EVERY DAY   multivitamin with minerals tablet Take by mouth.        Follow-up: Return in about 1 year (around 12/19/2020) for [facility] physical.  Claretta Fraise, M.D.

## 2019-12-21 LAB — CMP14+EGFR
ALT: 17 IU/L (ref 0–44)
AST: 23 IU/L (ref 0–40)
Albumin/Globulin Ratio: 2 (ref 1.2–2.2)
Albumin: 4.7 g/dL (ref 3.8–4.8)
Alkaline Phosphatase: 120 IU/L — ABNORMAL HIGH (ref 39–117)
BUN/Creatinine Ratio: 28 — ABNORMAL HIGH (ref 10–24)
BUN: 21 mg/dL (ref 8–27)
Bilirubin Total: 0.8 mg/dL (ref 0.0–1.2)
CO2: 22 mmol/L (ref 20–29)
Calcium: 9.5 mg/dL (ref 8.6–10.2)
Chloride: 100 mmol/L (ref 96–106)
Creatinine, Ser: 0.74 mg/dL — ABNORMAL LOW (ref 0.76–1.27)
GFR calc Af Amer: 113 mL/min/{1.73_m2} (ref >59)
GFR calc non Af Amer: 97 mL/min/{1.73_m2} (ref >59)
Globulin, Total: 2.3 g/dL (ref 1.5–4.5)
Glucose: 90 mg/dL (ref 65–99)
Potassium: 4 mmol/L (ref 3.5–5.2)
Sodium: 136 mmol/L (ref 134–144)
Total Protein: 7 g/dL (ref 6.0–8.5)

## 2019-12-21 LAB — VITAMIN D 25 HYDROXY (VIT D DEFICIENCY, FRACTURES): Vit D, 25-Hydroxy: 24.1 ng/mL — ABNORMAL LOW (ref 30.0–100.0)

## 2019-12-21 LAB — CBC WITH DIFFERENTIAL/PLATELET
Basophils Absolute: 0 10*3/uL (ref 0.0–0.2)
Basos: 1 %
EOS (ABSOLUTE): 0.1 10*3/uL (ref 0.0–0.4)
Eos: 2 %
Hematocrit: 41.5 % (ref 37.5–51.0)
Hemoglobin: 14.7 g/dL (ref 13.0–17.7)
Immature Grans (Abs): 0 10*3/uL (ref 0.0–0.1)
Immature Granulocytes: 0 %
Lymphocytes Absolute: 1.2 10*3/uL (ref 0.7–3.1)
Lymphs: 23 %
MCH: 32.7 pg (ref 26.6–33.0)
MCHC: 35.4 g/dL (ref 31.5–35.7)
MCV: 92 fL (ref 79–97)
Monocytes Absolute: 0.5 10*3/uL (ref 0.1–0.9)
Monocytes: 11 %
Neutrophils Absolute: 3.1 10*3/uL (ref 1.4–7.0)
Neutrophils: 63 %
Platelets: 184 10*3/uL (ref 150–450)
RBC: 4.5 x10E6/uL (ref 4.14–5.80)
RDW: 12.1 % (ref 11.6–15.4)
WBC: 5 10*3/uL (ref 3.4–10.8)

## 2019-12-21 LAB — PSA TOTAL (REFLEX TO FREE): Prostate Specific Ag, Serum: 0.6 ng/mL (ref 0.0–4.0)

## 2019-12-21 LAB — LIPID PANEL
Chol/HDL Ratio: 3.3 ratio (ref 0.0–5.0)
Cholesterol, Total: 168 mg/dL (ref 100–199)
HDL: 51 mg/dL (ref >39)
LDL Chol Calc (NIH): 89 mg/dL (ref 0–99)
Triglycerides: 160 mg/dL — ABNORMAL HIGH (ref 0–149)
VLDL Cholesterol Cal: 28 mg/dL (ref 5–40)

## 2019-12-21 MED ORDER — VITAMIN D (ERGOCALCIFEROL) 1.25 MG (50000 UNIT) PO CAPS: 50000.0000 [IU] | ORAL_CAPSULE | ORAL | 1 refills | Status: DC

## 2019-12-21 NOTE — Addendum Note (Signed)
Addended by: Ignacia Bayley on: 12/21/2019 04:34 PM   Modules accepted: Orders

## 2020-04-09 ENCOUNTER — Other Ambulatory Visit: Payer: Self-pay

## 2020-04-09 ENCOUNTER — Ambulatory Visit (INDEPENDENT_AMBULATORY_CARE_PROVIDER_SITE_OTHER): Payer: Medicare Other | Admitting: Family

## 2020-04-09 ENCOUNTER — Encounter: Payer: Self-pay | Admitting: Family

## 2020-04-09 VITALS — BP 108/64 | HR 66 | Temp 98.0°F | Ht 71.0 in | Wt 180.2 lb

## 2020-04-09 DIAGNOSIS — Z5189 Encounter for other specified aftercare: Secondary | ICD-10-CM | POA: Diagnosis not present

## 2020-04-09 DIAGNOSIS — Z4802 Encounter for removal of sutures: Secondary | ICD-10-CM | POA: Diagnosis not present

## 2020-04-09 NOTE — Progress Notes (Signed)
   Subjective:    Patient ID: Steven Cummings, male    DOB: [birth year], 64 y.o.   MRN: [id number]  Chief Complaint  Patient presents with  . remove sutures from toe on left foot    HPI Pt presents to the office today to have sutures removed from left fourth toe. He states he was using a chainsaw and "knicked" his toe on 03/30/20. He went to the Urgent Care and had 4 sutures placed. He had a negative x-ray and was given Keflex and completed this.   Denies any fevers, erythemas, discharge, or warmth.    Review of Systems  All other systems reviewed and are negative.      Objective:   Physical Exam Vitals reviewed.  Constitutional:      General: He is not in acute distress.    Appearance: He is well-developed.  HENT:     Head: Normocephalic.  Eyes:     General:        Right eye: No discharge.        Left eye: No discharge.     Pupils: Pupils are equal, round, and reactive to light.  Neck:     Thyroid: No thyromegaly.  Cardiovascular:     Rate and Rhythm: Normal rate and regular rhythm.     Heart sounds: Normal heart sounds. No murmur.  Pulmonary:     Effort: Pulmonary effort is normal. No respiratory distress.     Breath sounds: Normal breath sounds. No wheezing.  Abdominal:     General: Bowel sounds are normal. There is no distension.     Palpations: Abdomen is soft.     Tenderness: There is no abdominal tenderness.  Musculoskeletal:        General: No tenderness. Normal range of motion.     Cervical back: Normal range of motion and neck supple.  Skin:    General: Skin is warm and dry.     Findings: Laceration present. No erythema or rash.     Comments: Well approximated laceration on fourth toe. Erythemas present. No discharge, fever, or warmth.   Neurological:     Mental Status: He is alert and oriented to person, place, and time.     Cranial Nerves: No cranial nerve deficit.     Deep Tendon Reflexes: Reflexes are normal and symmetric.  Psychiatric:      Behavior: Behavior normal.        Thought Content: Thought content normal.        Judgment: Judgment normal.      BP 108/64   Pulse 66   Temp 98 F (36.7 C) (Temporal)   Ht 5\' 11"  (1.803 m)   Wt 180 lb 3.2 oz (81.7 kg)   SpO2 98%   BMI 25.13 kg/m       Assessment & Plan:  Steven Cummings comes in today with chief complaint of remove sutures from toe on left foot   Diagnosis and orders addressed:  1. Visit for suture removal Area looks good  2. Visit for wound check Discussion of s/s of infection and to let me know if he has any redness, fever, discharge, increased tenderness, or warmth   Steven Ligas, FNP

## 2020-04-09 NOTE — Patient Instructions (Signed)
Suture Removal, Care After This sheet gives you information about how to care for yourself after your procedure. Your health care provider may also give you more specific instructions. If you have problems or questions, contact your health care provider. What can I expect after the procedure? After your stitches (sutures) are removed, it is common to have:  Some discomfort and swelling in the area.  Slight redness in the area. Follow these instructions at home: If you have a bandage:  Wash your hands with soap and water before you change your bandage (dressing). If soap and water are not available, use hand sanitizer.  Change your dressing as told by your health care provider. If your dressing becomes wet or dirty, or develops a bad smell, change it as soon as possible.  If your dressing sticks to your skin, soak it in warm water to loosen it. Wound care   Check your wound every day for signs of infection. Check for: ? More redness, swelling, or pain. ? Fluid or blood. ? Warmth. ? Pus or a bad smell.  Wash your hands with soap and water before and after touching your wound.  Apply cream or ointment only as directed by your health care provider. If you are using cream or ointment, wash the area with soap and water 2 times a day to remove all the cream or ointment. Rinse off the soap and pat the area dry with a clean towel.  If you have skin glue or adhesive strips on your wound, leave these closures in place. They may need to stay in place for 2 weeks or longer. If adhesive strip edges start to loosen and curl up, you may trim the loose edges. Do not remove adhesive strips completely unless your health care provider tells you to do that.  Keep the wound area dry and clean. Do not take baths, swim, or use a hot tub until your health care provider approves.  Continue to protect the wound from injury.  Do not pick at your wound. Picking can cause an infection.  When your wound has  completely healed, wear sunscreen over it or cover it with clothing when you are outside. New scars get sunburned easily, which can make scarring worse. General instructions  Take over-the-counter and prescription medicines only as told by your health care provider.  Keep all follow-up visits as told by your health care provider. This is important. Contact a health care provider if:  You have redness, swelling, or pain around your wound.  You have fluid or blood coming from your wound.  Your wound feels warm to the touch.  You have pus or a bad smell coming from your wound.  Your wound opens up. Get help right away if:  You have a fever.  You have redness that is spreading from your wound. Summary  After your sutures are removed, it is common to have some discomfort and swelling in the area.  Wash your hands with soap and water before you change your bandage (dressing).  Keep the wound area dry and clean. Do not take baths, swim, or use a hot tub until your health care provider approves. This information is not intended to replace advice given to you by your health care provider. Make sure you discuss any questions you have with your health care provider. Document Revised: 10/15/2017 Document Reviewed: 12/08/2016 Elsevier Patient Education  2020 [employer]  

## 2020-06-12 ENCOUNTER — Other Ambulatory Visit: Payer: Self-pay | Admitting: Family Medicine

## 2020-06-19 IMAGING — US US SOFT TISSUE HEAD/NECK
1 series · 11 of 11 positions shown · non-contrast
Comparison: None.

CLINICAL DATA: Right-sided neck pain.

EXAM:
ULTRASOUND OF HEAD/NECK SOFT TISSUES
TECHNIQUE: Ultrasound examination of the head and neck soft tissues was
performed in the area of clinical concern.

[Series 1: us soft tissue head/neck · 0.05mm/px · 11 of 11 slices shown]
[im 1/11]
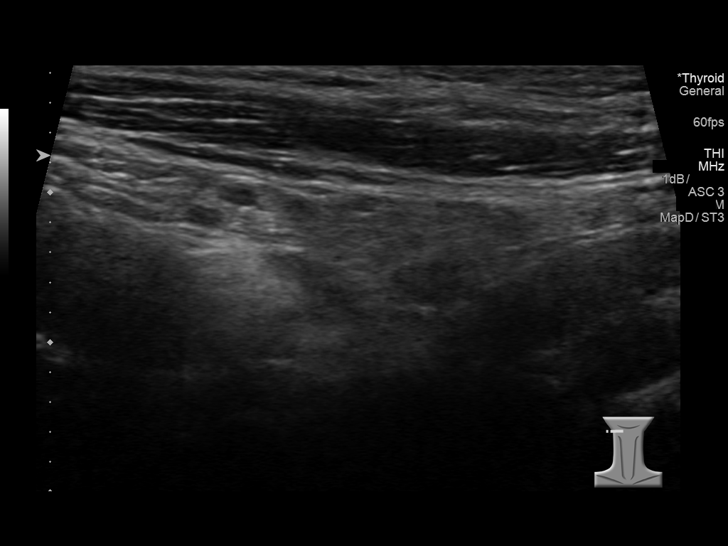
[im 2/11]
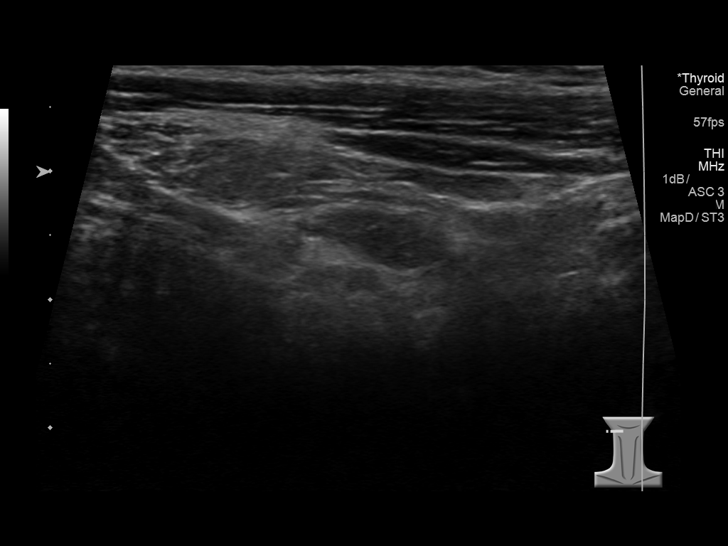
[im 3/11]
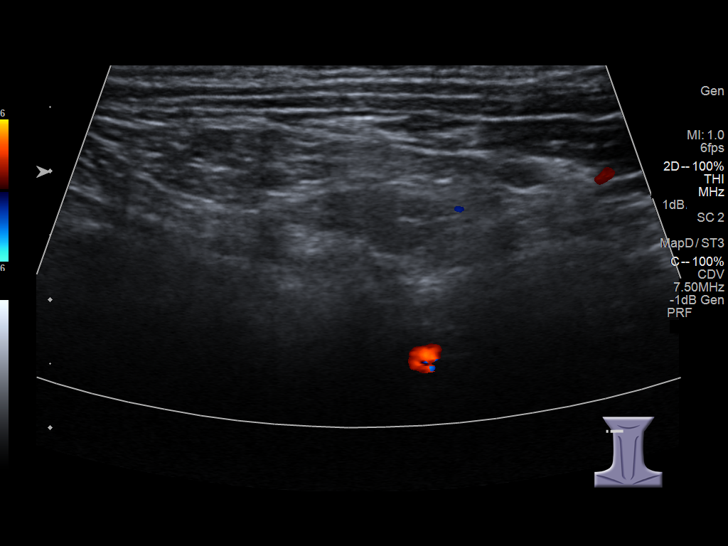
[im 4/11]
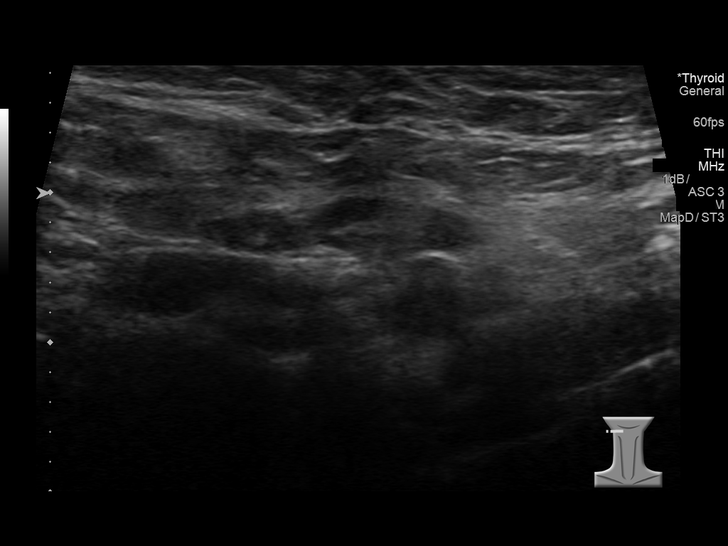
[im 5/11]
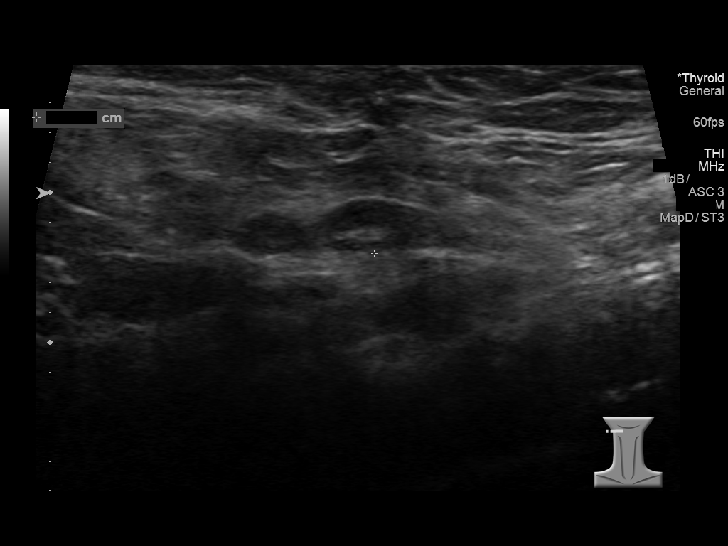
[im 6/11]
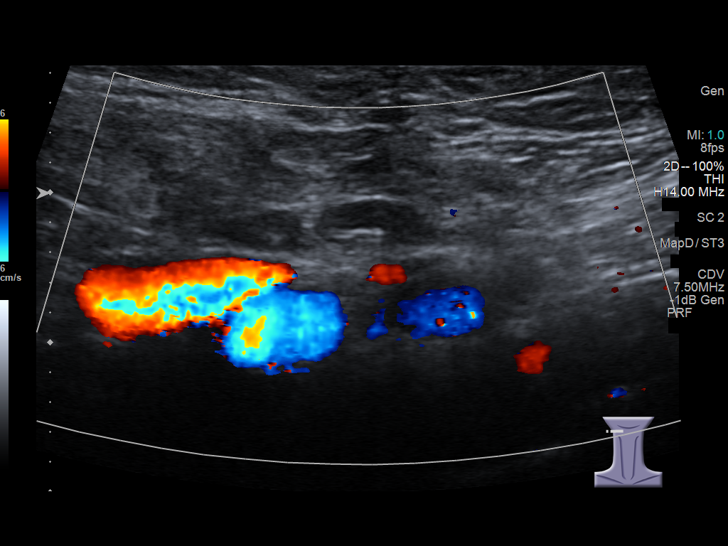
[im 7/11]
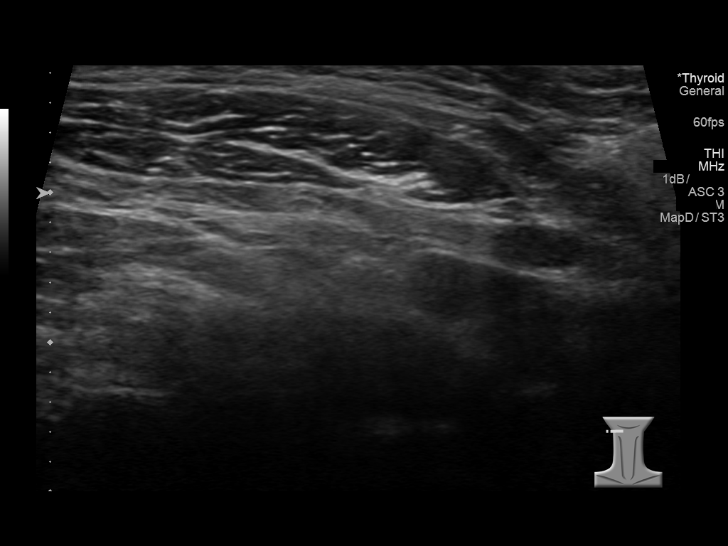
[im 8/11]
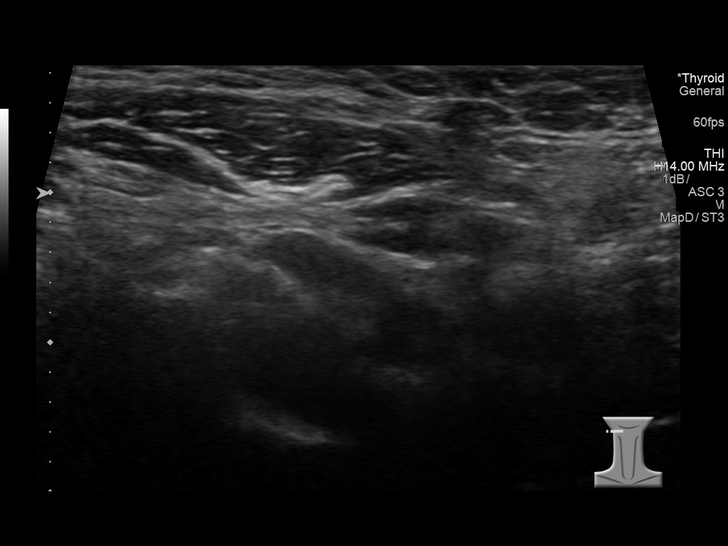
[im 9/11]
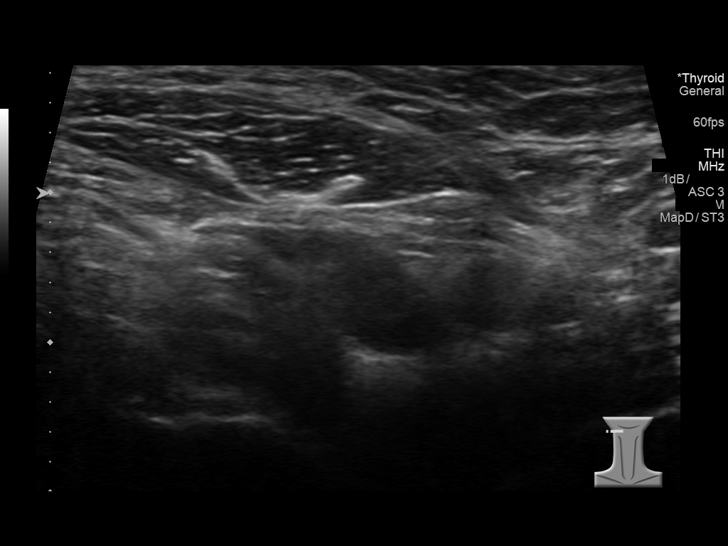
[im 10/11]
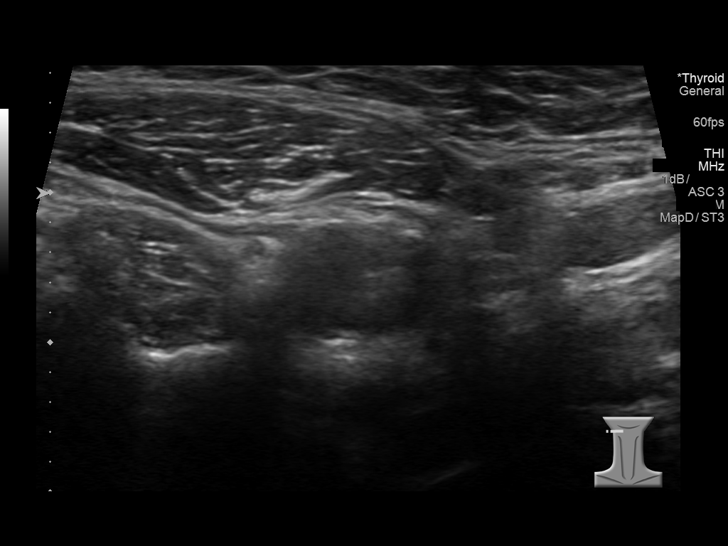
[im 11/11]
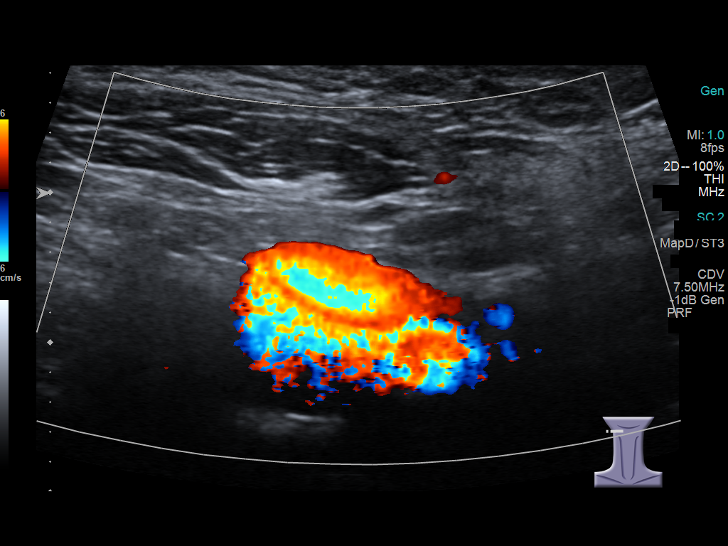

[11 of 11 positions shown; findings below may reference images not displayed]

FINDINGS: No focal soft tissue mass or fluid collection is identified. Normal
appearing, nonenlarged cervical lymph nodes are identified in the
right neck with the largest measuring 0.4 cm in short axis.
IMPRESSION: Unremarkable neck ultrasound. In the right neck, small, nonenlarged
cervical lymph nodes are seen.

## 2020-06-26 ENCOUNTER — Other Ambulatory Visit: Payer: Self-pay | Admitting: Family Medicine

## 2020-08-21 ENCOUNTER — Ambulatory Visit (INDEPENDENT_AMBULATORY_CARE_PROVIDER_SITE_OTHER): Payer: Medicare Other

## 2020-08-21 ENCOUNTER — Other Ambulatory Visit: Payer: Self-pay

## 2020-08-21 DIAGNOSIS — Z23 Encounter for immunization: Secondary | ICD-10-CM

## 2020-09-17 ENCOUNTER — Other Ambulatory Visit: Payer: Self-pay | Admitting: Family Medicine

## 2020-09-23 ENCOUNTER — Other Ambulatory Visit: Payer: Self-pay | Admitting: Family Medicine

## 2021-01-02 ENCOUNTER — Encounter: Payer: Self-pay | Admitting: Family Medicine

## 2021-01-02 ENCOUNTER — Ambulatory Visit (INDEPENDENT_AMBULATORY_CARE_PROVIDER_SITE_OTHER): Payer: Medicare Other | Admitting: Family Medicine

## 2021-01-02 ENCOUNTER — Other Ambulatory Visit: Payer: Self-pay

## 2021-01-02 VITALS — BP 109/64 | HR 60 | Temp 97.1°F | Resp 20 | Ht 71.0 in | Wt 180.4 lb

## 2021-01-02 DIAGNOSIS — Z125 Encounter for screening for malignant neoplasm of prostate: Secondary | ICD-10-CM

## 2021-01-02 DIAGNOSIS — M799 Soft tissue disorder, unspecified: Secondary | ICD-10-CM

## 2021-01-02 DIAGNOSIS — N401 Enlarged prostate with lower urinary tract symptoms: Secondary | ICD-10-CM

## 2021-01-02 DIAGNOSIS — Z0000 Encounter for general adult medical examination without abnormal findings: Secondary | ICD-10-CM

## 2021-01-02 DIAGNOSIS — R351 Nocturia: Secondary | ICD-10-CM | POA: Diagnosis not present

## 2021-01-02 DIAGNOSIS — E559 Vitamin D deficiency, unspecified: Secondary | ICD-10-CM

## 2021-01-02 DIAGNOSIS — Z13220 Encounter for screening for lipoid disorders: Secondary | ICD-10-CM

## 2021-01-02 DIAGNOSIS — Z136 Encounter for screening for cardiovascular disorders: Secondary | ICD-10-CM

## 2021-01-02 LAB — URINALYSIS
Bilirubin, UA: NEGATIVE
Glucose, UA: NEGATIVE
Ketones, UA: NEGATIVE
Leukocytes,UA: NEGATIVE
Nitrite, UA: NEGATIVE
Protein,UA: NEGATIVE
RBC, UA: NEGATIVE
Specific Gravity, UA: 1.01 (ref 1.005–1.030)
Urobilinogen, Ur: 0.2 mg/dL (ref 0.2–1.0)
pH, UA: 7 (ref 5.0–7.5)

## 2021-01-02 MED ORDER — EPINEPHRINE 0.3 MG/0.3ML IJ SOAJ
0.3000 mg | INTRAMUSCULAR | 5 refills | Status: AC | PRN
Start: 2021-01-02 — End: ?

## 2021-01-02 NOTE — Progress Notes (Signed)
Subjective:  Patient ID: Steven Cummings, male    DOB: January 28, 1956  Age: 65 y.o. MRN: [id number]  CC: Annual Exam   HPI [name] presents for Annual exam  Depression screen [facility] 2/9 01/02/2021 04/09/2020 12/20/2019  Decreased Interest 0 0 0  Down, Depressed, Hopeless 0 0 0  PHQ - 2 Score 0 0 0  Altered sleeping - - -  Tired, decreased energy - - -  Change in appetite - - -  Feeling bad or failure about yourself  - - -  Trouble concentrating - - -  Moving slowly or fidgety/restless - - -  Suicidal thoughts - - -  PHQ-9 Score - - -  Difficult doing work/chores - - -    History Linkyn has a past medical history of Allergy and CRPS (complex regional pain syndrome), upper limb.   He has a past surgical history that includes Cardiac catheterization (2003); Tonsilectomy/adenoidectomy with myringotomy (1960); Knee surgery (Left, 1998); and Shoulder arthroscopy with rotator cuff repair (Right, 2017).   His family history includes Arthritis in his maternal grandfather, maternal grandmother, paternal grandfather, and paternal grandmother; Heart disease in his father and paternal grandfather; Hyperlipidemia in his father and mother; Hypertension in his father and mother; Kidney disease in his father; Stroke in his father.He reports that he quit smoking about 35 years ago. His smoking use included cigarettes. He has a 14.00 pack-year smoking history. He has never used smokeless tobacco. He reports current alcohol use of about 3.0 standard drinks of alcohol per week. He reports that he does not use drugs.    ROS Review of Systems  Constitutional: Negative for activity change, fatigue and unexpected weight change.  HENT: Negative for congestion, ear pain, hearing loss, postnasal drip and trouble swallowing.   Eyes: Negative for pain and visual disturbance.  Respiratory: Negative for cough, chest tightness and shortness of breath.   Cardiovascular: Negative for chest pain,  palpitations and leg swelling.  Gastrointestinal: Negative for abdominal distention, abdominal pain, blood in stool, constipation, diarrhea, nausea and vomiting.  Endocrine: Negative for cold intolerance, heat intolerance and polydipsia.  Genitourinary: Negative for difficulty urinating, dysuria, flank pain, frequency and urgency.  Musculoskeletal: Negative for arthralgias and joint swelling.  Skin: Negative for color change, rash and wound.  Neurological: Negative for dizziness, syncope, speech difficulty, weakness, light-headedness, numbness and headaches.  Hematological: Does not bruise/bleed easily.  Psychiatric/Behavioral: Negative for confusion, decreased concentration, dysphoric mood and sleep disturbance. The patient is not nervous/anxious.     Objective:  BP 109/64   Pulse 60   Temp (!) 97.1 F (36.2 C) (Temporal)   Resp 20   Ht '5\' 11"'  (1.803 m)   Wt 180 lb 6 oz (81.8 kg)   SpO2 98%   BMI 25.16 kg/m   BP Readings from Last 3 Encounters:  01/02/21 109/64  04/09/20 108/64  12/20/19 106/60    Wt Readings from Last 3 Encounters:  01/02/21 180 lb 6 oz (81.8 kg)  04/09/20 180 lb 3.2 oz (81.7 kg)  12/20/19 179 lb (81.2 kg)     Physical Exam Constitutional:      Appearance: He is well-developed and well-nourished.  HENT:     Head: Normocephalic and atraumatic.     Mouth/Throat:     Mouth: Oropharynx is clear and moist.  Eyes:     Extraocular Movements: EOM normal.     Pupils: Pupils are equal, round, and reactive to light.  Neck:     Thyroid: No  thyromegaly.     Trachea: No tracheal deviation.  Cardiovascular:     Rate and Rhythm: Normal rate and regular rhythm.     Heart sounds: Normal heart sounds. No murmur heard. No friction rub. No gallop.   Pulmonary:     Breath sounds: Normal breath sounds. No wheezing or rales.  Abdominal:     General: Bowel sounds are normal. There is no distension.     Palpations: Abdomen is soft. There is no mass.     Tenderness:  There is no abdominal tenderness.     Hernia: There is no hernia in the right inguinal area or left inguinal area.  Genitourinary:    Penis: Normal.      Testes: Normal.  Musculoskeletal:        General: No edema. Normal range of motion.     Cervical back: Normal range of motion.  Lymphadenopathy:     Cervical: No cervical adenopathy.  Skin:    General: Skin is warm and dry.  Neurological:     Mental Status: He is alert and oriented to person, place, and time.  Psychiatric:        Mood and Affect: Mood and affect normal.       Assessment & Plan:   Tyrice was seen today for annual exam.  Diagnoses and all orders for this visit:  Well adult exam -     CBC with Differential/Platelet -     CMP14+EGFR -     Lipid panel -     Urinalysis -     VITAMIN D 25 Hydroxy (Vit-D Deficiency, Fractures) -     [place] [employer] SCREENING; Future  Screening for prostate cancer -     PSA, total and free  Vitamin D deficiency -     CBC with Differential/Platelet -     CMP14+EGFR -     VITAMIN D 25 Hydroxy (Vit-D Deficiency, Fractures)  Screening for cholesterol level -     Lipid panel  Screening for AAA (abdominal aortic aneurysm) -     [place] [employer] SCREENING; Future  Benign prostatic hyperplasia with nocturia -     PSA, total and free  Soft tissue lesion of foot -     Ambulatory referral to Podiatry  Other orders -     EPINEPHrine 0.3 mg/0.3 mL IJ SOAJ injection; Inject 0.3 mg into the muscle as needed for anaphylaxis.       I am having Steven Cummings "[name]" start on EPINEPHrine. I am also having him maintain his multivitamin with minerals, aspirin EC, fluticasone, and Vitamin D (Ergocalciferol).  Allergies as of 01/02/2021      Reactions   Sulfa Antibiotics Rash      Medication List       Accurate as of January 02, 2021 11:59 PM. If you have any questions, ask your nurse or doctor.        aspirin EC 81 MG tablet Take 81 mg by mouth.    EPINEPHrine 0.3 mg/0.3 mL Soaj injection Commonly known as: EPI-PEN Inject 0.3 mg into the muscle as needed for anaphylaxis. Started by: Claretta Fraise, MD   fluticasone 50 MCG/ACT nasal spray Commonly known as: FLONASE SPRAY 2 SPRAYS INTO EACH NOSTRIL EVERY DAY   multivitamin with minerals tablet Take by mouth.   Vitamin D (Ergocalciferol) 1.25 MG (50000 UNIT) Caps capsule Commonly known as: DRISDOL Take 50,000 Units by mouth every 7 (seven) days. What changed: Another medication with the same name was  removed. Continue taking this medication, and follow the directions you see here. Changed by: Claretta Fraise, MD        Follow-up: No follow-ups on file.  Claretta Fraise, M.D.

## 2021-01-03 LAB — CBC WITH DIFFERENTIAL/PLATELET
Basophils Absolute: 0 10*3/uL (ref 0.0–0.2)
Basos: 1 %
EOS (ABSOLUTE): 0.1 10*3/uL (ref 0.0–0.4)
Eos: 3 %
Hematocrit: 42.9 % (ref 37.5–51.0)
Hemoglobin: 14.6 g/dL (ref 13.0–17.7)
Immature Grans (Abs): 0 10*3/uL (ref 0.0–0.1)
Immature Granulocytes: 0 %
Lymphocytes Absolute: 1.4 10*3/uL (ref 0.7–3.1)
Lymphs: 32 %
MCH: 31.6 pg (ref 26.6–33.0)
MCHC: 34 g/dL (ref 31.5–35.7)
MCV: 93 fL (ref 79–97)
Monocytes Absolute: 0.6 10*3/uL (ref 0.1–0.9)
Monocytes: 13 %
Neutrophils Absolute: 2.2 10*3/uL (ref 1.4–7.0)
Neutrophils: 51 %
Platelets: 190 10*3/uL (ref 150–450)
RBC: 4.62 x10E6/uL (ref 4.14–5.80)
RDW: 12.3 % (ref 11.6–15.4)
WBC: 4.3 10*3/uL (ref 3.4–10.8)

## 2021-01-03 LAB — PSA, TOTAL AND FREE
PSA, Free Pct: 24.3 %
PSA, Free: 0.17 ng/mL
Prostate Specific Ag, Serum: 0.7 ng/mL (ref 0.0–4.0)

## 2021-01-03 LAB — LIPID PANEL
Chol/HDL Ratio: 3.7 ratio (ref 0.0–5.0)
Cholesterol, Total: 183 mg/dL (ref 100–199)
HDL: 50 mg/dL (ref >39)
LDL Chol Calc (NIH): 119 mg/dL — ABNORMAL HIGH (ref 0–99)
Triglycerides: 78 mg/dL (ref 0–149)
VLDL Cholesterol Cal: 14 mg/dL (ref 5–40)

## 2021-01-03 LAB — CMP14+EGFR
ALT: 16 IU/L (ref 0–44)
AST: 26 IU/L (ref 0–40)
Albumin/Globulin Ratio: 2.2 (ref 1.2–2.2)
Albumin: 4.8 g/dL (ref 3.8–4.8)
Alkaline Phosphatase: 110 IU/L (ref 44–121)
BUN/Creatinine Ratio: 22 (ref 10–24)
BUN: 18 mg/dL (ref 8–27)
Bilirubin Total: 1.1 mg/dL (ref 0.0–1.2)
CO2: 21 mmol/L (ref 20–29)
Calcium: 9.1 mg/dL (ref 8.6–10.2)
Chloride: 97 mmol/L (ref 96–106)
Creatinine, Ser: 0.81 mg/dL (ref 0.76–1.27)
GFR calc Af Amer: 108 mL/min/{1.73_m2} (ref >59)
GFR calc non Af Amer: 93 mL/min/{1.73_m2} (ref >59)
Globulin, Total: 2.2 g/dL (ref 1.5–4.5)
Glucose: 91 mg/dL (ref 65–99)
Potassium: 4.2 mmol/L (ref 3.5–5.2)
Sodium: 136 mmol/L (ref 134–144)
Total Protein: 7 g/dL (ref 6.0–8.5)

## 2021-01-03 LAB — VITAMIN D 25 HYDROXY (VIT D DEFICIENCY, FRACTURES): Vit D, 25-Hydroxy: 45.8 ng/mL (ref 30.0–100.0)

## 2021-01-05 ENCOUNTER — Encounter: Payer: Self-pay | Admitting: Family Medicine

## 2021-01-05 NOTE — Progress Notes (Signed)
Hello Steven Cummings,  Your lab result is normal and/or stable.Some minor variations that are not significant are commonly marked abnormal, but do not represent any medical problem for you.  Best regards, Ogechi Kuehnel, M.D.

## 2021-01-10 ENCOUNTER — Ambulatory Visit (HOSPITAL_COMMUNITY)
Admission: RE | Admit: 2021-01-10 | Discharge: 2021-01-10 | Disposition: A | Discharge status: Home / Self Care | Payer: Medicare Other | Source: Ambulatory Visit | Attending: Family Medicine | Admitting: Family Medicine

## 2021-01-10 ENCOUNTER — Other Ambulatory Visit: Payer: Self-pay

## 2021-01-10 DIAGNOSIS — Z136 Encounter for screening for cardiovascular disorders: Secondary | ICD-10-CM | POA: Diagnosis present

## 2021-01-10 DIAGNOSIS — Z0000 Encounter for general adult medical examination without abnormal findings: Secondary | ICD-10-CM | POA: Diagnosis not present

## 2021-01-12 NOTE — Addendum Note (Signed)
Addended by: Mechele Claude on: 01/12/2021 09:43 PM   Modules accepted: Level of Service

## 2021-03-04 ENCOUNTER — Other Ambulatory Visit: Payer: Self-pay | Admitting: Family Medicine

## 2021-03-04 DIAGNOSIS — J309 Allergic rhinitis, unspecified: Secondary | ICD-10-CM

## 2021-10-31 ENCOUNTER — Telehealth: Payer: Self-pay | Admitting: Family Medicine

## 2021-12-03 ENCOUNTER — Ambulatory Visit (INDEPENDENT_AMBULATORY_CARE_PROVIDER_SITE_OTHER): Payer: Medicare Other

## 2021-12-03 VITALS — Ht 71.0 in | Wt 170.0 lb

## 2021-12-03 DIAGNOSIS — Z0000 Encounter for general adult medical examination without abnormal findings: Secondary | ICD-10-CM

## 2021-12-03 NOTE — Progress Notes (Signed)
Subjective:   Steven Cummings is a 66 y.o. male who presents for an Initial Medicare Annual Wellness Visit.  Virtual Visit via Telephone Note  I connected with  Steven Cummings on 12/03/21 at  4:15 PM EST by telephone and verified that I am speaking with the correct person using two identifiers.  Location: Patient: Home Provider: [facility] Persons participating in the virtual visit: patient/Nurse Health Advisor   I discussed the limitations, risks, security and privacy concerns of performing an evaluation and management service by telephone and the availability of in person appointments. The patient expressed understanding and agreed to proceed.  Interactive audio and video telecommunications were attempted between this nurse and patient, however failed, due to patient having technical difficulties OR patient did not have access to video capability.  We continued and completed visit with audio only.  Some vital signs may be absent or patient reported.   Jarious Lyon E Ky Moskowitz, LPN   Review of Systems     Cardiac Risk Factors include: advanced age (>155men, 70>65 women);male gender     Objective:    Today's Vitals   12/03/21 1604  Weight: 170 lb (77.1 kg)  Height: 5\' 11"  (1.803 m)   Body mass index is 23.71 kg/m.  Advanced Directives 12/03/2021  Does Patient Have a Medical Advance Directive? Yes  Type of Estate agentAdvance Directive Healthcare Power of [place];Living will  Copy of Healthcare Power of Attorney in Chart? No - copy requested    Current Medications (verified) Outpatient Encounter Medications as of 12/03/2021  Medication Sig   aspirin EC 81 MG tablet Take 81 mg by mouth.   Cholecalciferol (VITAMIN D3) 50 MCG (2000 UT) TABS    EPINEPHrine 0.3 mg/0.3 mL IJ SOAJ injection Inject 0.3 mg into the muscle as needed for anaphylaxis.   fluticasone (FLONASE) 50 MCG/ACT nasal spray SPRAY 2 SPRAYS INTO EACH NOSTRIL EVERY DAY   Multiple Vitamins-Minerals (MULTIVITAMIN WITH MINERALS)  tablet Take by mouth.   Vitamin D, Ergocalciferol, (DRISDOL) 1.25 MG (50000 UNIT) CAPS capsule Take 50,000 Units by mouth every 7 (seven) days. (Patient not taking: Reported on 12/03/2021)   No facility-administered encounter medications on file as of 12/03/2021.    Allergies (verified) Sulfa antibiotics   History: Past Medical History:  Diagnosis Date   Allergy    CRPS (complex regional pain syndrome), upper limb    Past Surgical History:  Procedure Laterality Date   CARDIAC CATHETERIZATION  2003   KNEE SURGERY Left 1998   SHOULDER ARTHROSCOPY WITH ROTATOR CUFF REPAIR Right 2017   TONSILECTOMY/ADENOIDECTOMY WITH MYRINGOTOMY  1960   Family History  Problem Relation Age of Onset   Hyperlipidemia Mother    Hypertension Mother    Heart disease Father    Hyperlipidemia Father    Hypertension Father    Kidney disease Father    Stroke Father    Arthritis Maternal Grandmother    Arthritis Maternal Grandfather    Arthritis Paternal Grandmother    Arthritis Paternal Grandfather    Heart disease Paternal Grandfather    Social History   Socioeconomic History   Marital status: Married    Spouse name: Not on file   Number of children: 2   Years of education: Not on file   Highest education level: Not on file  Occupational History   Occupation: retired since age 66    Comment: business owner, 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, etc  Tobacco Use   Smoking status: Former    Packs/day: 1.00    Years: 14.00  Pack years: 14.00    Types: Cigarettes    Quit date: 12/14/1985    Years since quitting: 35.9   Smokeless tobacco: Never  Vaping Use   Vaping Use: Never used  Substance and Sexual Activity   Alcohol use: Yes    Alcohol/week: 3.0 standard drinks    Types: 3 Cans of beer per week   Drug use: No   Sexual activity: Yes  Other Topics Concern   Not on file  Social History Narrative   1 son in [place], 1 daughter 2 miles away, 3 grandsons - they all spend lots of time together   Father  living in [place] with dementia   2 story home with wife - very active 15,000-20,000 steps per day   Social Determinants of Health   Financial Resource Strain: Low Risk    Difficulty of Paying Living Expenses: Not hard at all  Food Insecurity: No Food Insecurity   Worried About Programme researcher, broadcasting/film/video in the Last Year: Never true   Barista in the Last Year: Never true  Transportation Needs: No Transportation Needs   Lack of Transportation (Medical): No   Lack of Transportation (Non-Medical): No  Physical Activity: Sufficiently Active   Days of Exercise per Week: 7 days   Minutes of Exercise per Session: 60 min  Stress: No Stress Concern Present   Feeling of Stress : Not at all  Social Connections: Moderately Integrated   Frequency of Communication with Friends and Family: More than three times a week   Frequency of Social Gatherings with Friends and Family: More than three times a week   Attends Religious Services: Never   Database administrator or Organizations: Yes   Attends Engineer, structural: More than 4 times per year   Marital Status: Married    Tobacco Counseling Counseling given: Not Answered   Clinical Intake:  Pre-visit preparation completed: Yes  Pain : No/denies pain     BMI - recorded: 23.71 Nutritional Status: BMI of 19-24  Normal Nutritional Risks: None Diabetes: No  How often do you need to have someone help you when you read instructions, pamphlets, or other written materials from your doctor or pharmacy?: 1 - Never  Diabetic? no  Interpreter Needed?: No  Information entered by :: Steven Radick, LPN   Activities of Daily Living In your present state of health, do you have any difficulty performing the following activities: 12/03/2021  Hearing? N  Vision? N  Difficulty concentrating or making decisions? N  Walking or climbing stairs? N  Dressing or bathing? N  Doing errands, shopping? N  Preparing Food and eating ? N  Using  the Toilet? N  In the past six months, have you accidently leaked urine? N  Do you have problems with loss of bowel control? N  Managing your Medications? N  Managing your Finances? N  Housekeeping or managing your Housekeeping? N  Some recent data might be hidden    Patient Care Team: Mechele Claude, MD as PCP - General (Family Medicine)  Indicate any recent Medical Services you may have received from other than [facility] providers in the past year (date may be approximate).     Assessment:   This is a routine wellness examination for Steven Cummings.  Hearing/Vision screen Hearing Screening - Comments:: C/o mild hearing difficulties - high pitched sounds only Vision Screening - Comments:: Wears contacts or glasses - up to date with annual eye exams with [facility]  Dietary issues and exercise activities discussed: Current Exercise Habits: Home exercise routine, Type of exercise: strength training/weights;walking;Other - see comments (burpees, sit-ups, pull ups, push ups, etc), Time (Minutes): 60, Frequency (Times/Week): 7, Weekly Exercise (Minutes/Week): 420, Intensity: Intense, Exercise limited by: None identified   Goals Addressed             This Visit's Progress    Patient Stated       Stay healthy and active       Depression Screen PHQ 2/9 Scores 12/03/2021 01/02/2021 04/09/2020 12/20/2019 12/14/2018 10/12/2018 06/15/2018  PHQ - 2 Score 0 0 0 0 0 0 0  PHQ- 9 Score - - - - - 0 -    Fall Risk Fall Risk  12/03/2021 01/02/2021 04/09/2020 12/20/2019 06/15/2018  Falls in the past year? 0 0 0 0 No  Number falls in past yr: 0 - - 0 -  Injury with Fall? 0 - - 0 -  Risk for fall due to : No Fall Risks - - No Fall Risks -  Follow up Falls prevention discussed Falls evaluation completed - - -    FALL RISK PREVENTION PERTAINING TO THE HOME:  Any stairs in or around the home? Yes  If so, are there any without handrails? No  Home free of loose throw rugs in walkways, pet beds,  electrical cords, etc? Yes  Adequate lighting in your home to reduce risk of falls? Yes   ASSISTIVE DEVICES UTILIZED TO PREVENT FALLS:  Life alert? No  Use of a cane, walker or w/c? No  Grab bars in the bathroom? Yes  Shower chair or bench in shower? Yes  Elevated toilet seat or a handicapped toilet? Yes   TIMED UP AND GO:  Was the test performed? No . Telephonic visit  Cognitive Function:     6CIT Screen 12/03/2021  What Year? 0 points  What month? 0 points  What time? 0 points  Count back from 20 0 points  Months in reverse 0 points  Repeat phrase 0 points  Total Score 0    Immunizations Immunization History  Administered Date(s) Administered   Fluad Quad(high Dose 65+) 08/21/2020   Influenza,inj,Quad PF,6+ Mos 08/15/2018, 08/10/2019   Influenza-Unspecified 08/24/2017   Moderna Covid-19 Vaccine Bivalent Booster 56yrs & up 08/22/2021   Moderna Sars-Covid-2 Vaccination 01/11/2020, 02/09/2020, 09/17/2020   Td 09/08/2016   Tdap 09/08/2016    TDAP status: Up to date  Flu Vaccine status: Due, Education has been provided regarding the importance of this vaccine. Advised may receive this vaccine at local pharmacy or Health Dept. Aware to provide a copy of the vaccination record if obtained from local pharmacy or Health Dept. Verbalized acceptance and understanding.  Pneumococcal vaccine status: Due, Education has been provided regarding the importance of this vaccine. Advised may receive this vaccine at local pharmacy or Health Dept. Aware to provide a copy of the vaccination record if obtained from local pharmacy or Health Dept. Verbalized acceptance and understanding.  Covid-19 vaccine status: Completed vaccines  Qualifies for Shingles Vaccine? Yes   Zostavax completed No   Shingrix Completed?: No.    Education has been provided regarding the importance of this vaccine. Patient has been advised to call insurance company to determine out of pocket expense if they have not  yet received this vaccine. Advised may also receive vaccine at local pharmacy or Health Dept. Verbalized acceptance and understanding.  Screening Tests Health Maintenance  Topic Date Due   Zoster Vaccines- Shingrix (  1 of 2) Never done   Pneumonia Vaccine 4065+ Years old (1 - PCV) Never done   INFLUENZA VACCINE  06/16/2021   TETANUS/TDAP  09/08/2026   COLONOSCOPY (Pts 45-3311yrs Insurance coverage will need to be confirmed)  12/10/2026   COVID-19 Vaccine  Completed   Hepatitis C Screening  Completed   HPV VACCINES  Aged Out    Health Maintenance  Health Maintenance Due  Topic Date Due   Zoster Vaccines- Shingrix (1 of 2) Never done   Pneumonia Vaccine 265+ Years old (1 - PCV) Never done   INFLUENZA VACCINE  06/16/2021    Colorectal cancer screening: Type of screening: Colonoscopy. Completed 12/10/2016. Repeat every 10 years  Lung Cancer Screening: (Low Dose CT Chest recommended if Age 75-80 years, 30 pack-year currently smoking OR have quit w/in 15years.) does not qualify.  Additional Screening:  Hepatitis C Screening: does qualify; Completed 12/14/2017  Vision Screening: Recommended annual ophthalmology exams for early detection of glaucoma and other disorders of the eye. Is the patient up to date with their annual eye exam?  Yes  Who is the provider or what is the name of the office in which the patient attends annual eye exams? [facility] If pt is not established with a provider, would they like to be referred to a provider to establish care? No .   Dental Screening: Recommended annual dental exams for proper oral hygiene  Community Resource Referral / Chronic Care Management: CRR required this visit?  No   CCM required this visit?  No      Plan:     I have personally reviewed and noted the following in the patients chart:   Medical and social history Use of alcohol, tobacco or illicit drugs  Current medications and supplements including opioid  prescriptions. Patient is not currently taking opioid prescriptions. Functional ability and status Nutritional status Physical activity Advanced directives List of other physicians Hospitalizations, surgeries, and ER visits in previous 12 months Vitals Screenings to include cognitive, depression, and falls Referrals and appointments  In addition, I have reviewed and discussed with patient certain preventive protocols, quality metrics, and best practice recommendations. A written personalized care plan for preventive services as well as general preventive health recommendations were provided to patient.     [place] Constablemy E Eldoris Beiser, LPN   9/14/78291/18/2023   Nurse Notes: None

## 2021-12-03 NOTE — Patient Instructions (Signed)
[name] , Thank you for taking time to come for your Medicare Wellness Visit. I appreciate your ongoing commitment to your health goals. Please review the following plan we discussed and let me know if I can assist you in the future.   Screening recommendations/referrals: Colonoscopy: done 12/10/2016 - Repeat in 10 years Recommended yearly ophthalmology/optometry visit for glaucoma screening and checkup Recommended yearly dental visit for hygiene and checkup  Vaccinations: Influenza vaccine: Due Pneumococcal vaccine: Due Tdap vaccine: Done 09/08/2016 - Repeat in 10 years- Shingles vaccine: Due   Covid-19: Done 01/11/2020, 02/09/2020, 09/17/2020, & 08/22/2021  Advanced directives: Please bring a copy of your health care power of attorney and living will to the office to be added to your chart at your convenience.   Conditions/risks identified: Keep up the great work!   Next appointment: Follow up in one year for your annual wellness visit.   Preventive Care 67 Years and Older, Male  Preventive care refers to lifestyle choices and visits with your health care provider that can promote health and wellness. What does preventive care include? A yearly physical exam. This is also called an annual well check. Dental exams once or twice a year. Routine eye exams. Ask your health care provider how often you should have your eyes checked. Personal lifestyle choices, including: Daily care of your teeth and gums. Regular physical activity. Eating a healthy diet. Avoiding tobacco and drug use. Limiting alcohol use. Practicing safe sex. Taking low doses of aspirin every day. Taking vitamin and mineral supplements as recommended by your health care provider. What happens during an annual well check? The services and screenings done by your health care provider during your annual well check will depend on your age, overall health, lifestyle risk factors, and family history of  disease. Counseling  Your health care provider may ask you questions about your: Alcohol use. Tobacco use. Drug use. Emotional well-being. Home and relationship well-being. Sexual activity. Eating habits. History of falls. Memory and ability to understand (cognition). Work and work Astronomer. Screening  You may have the following tests or measurements: Height, weight, and BMI. Blood pressure. Lipid and cholesterol levels. These may be checked every 5 years, or more frequently if you are over 60 years old. Skin check. Lung cancer screening. You may have this screening every year starting at age 67 if you have a 30-pack-year history of smoking and currently smoke or have quit within the past 15 years. Fecal occult blood test (FOBT) of the stool. You may have this test every year starting at age 13. Flexible sigmoidoscopy or colonoscopy. You may have a sigmoidoscopy every 5 years or a colonoscopy every 10 years starting at age 60. Prostate cancer screening. Recommendations will vary depending on your family history and other risks. Hepatitis C blood test. Hepatitis B blood test. Sexually transmitted disease (STD) testing. Diabetes screening. This is done by checking your blood sugar (glucose) after you have not eaten for a while (fasting). You may have this done every 1-3 years. Abdominal aortic aneurysm (AAA) screening. You may need this if you are a current or former smoker. Osteoporosis. You may be screened starting at age 24 if you are at high risk. Talk with your health care provider about your test results, treatment options, and if necessary, the need for more tests. Vaccines  Your health care provider may recommend certain vaccines, such as: Influenza vaccine. This is recommended every year. Tetanus, diphtheria, and acellular pertussis (Tdap, Td) vaccine. You may need a  Td booster every 10 years. Zoster vaccine. You may need this after age 51. Pneumococcal 13-valent  conjugate (PCV13) vaccine. One dose is recommended after age 75. Pneumococcal polysaccharide (PPSV23) vaccine. One dose is recommended after age 54. Talk to your health care provider about which screenings and vaccines you need and how often you need them. This information is not intended to replace advice given to you by your health care provider. Make sure you discuss any questions you have with your health care provider. Document Released: 11/29/2015 Document Revised: 07/22/2016 Document Reviewed: 09/03/2015 Elsevier Interactive Patient Education  2017 [place] Prevention in the Home Falls can cause injuries. They can happen to people of all ages. There are many things you can do to make your home safe and to help prevent falls. What can I do on the outside of my home? Regularly fix the edges of walkways and driveways and fix any cracks. Remove anything that might make you trip as you walk through a door, such as a raised step or threshold. Trim any bushes or trees on the path to your home. Use bright outdoor lighting. Clear any walking paths of anything that might make someone trip, such as rocks or tools. Regularly check to see if handrails are loose or broken. Make sure that both sides of any steps have handrails. Any raised decks and porches should have guardrails on the edges. Have any leaves, snow, or ice cleared regularly. Use sand or salt on walking paths during winter. Clean up any spills in your garage right away. This includes oil or grease spills. What can I do in the bathroom? Use night lights. Install grab bars by the toilet and in the tub and shower. Do not use towel bars as grab bars. Use non-skid mats or decals in the tub or shower. If you need to sit down in the shower, use a plastic, non-slip stool. Keep the floor dry. Clean up any water that spills on the floor as soon as it happens. Remove soap buildup in the tub or shower regularly. Attach bath mats  securely with double-sided non-slip rug tape. Do not have throw rugs and other things on the floor that can make you trip. What can I do in the bedroom? Use night lights. Make sure that you have a light by your bed that is easy to reach. Do not use any sheets or blankets that are too big for your bed. They should not hang down onto the floor. Have a firm chair that has side arms. You can use this for support while you get dressed. Do not have throw rugs and other things on the floor that can make you trip. What can I do in the kitchen? Clean up any spills right away. Avoid walking on wet floors. Keep items that you use a lot in easy-to-reach places. If you need to reach something above you, use a strong step stool that has a grab bar. Keep electrical cords out of the way. Do not use floor polish or wax that makes floors slippery. If you must use wax, use non-skid floor wax. Do not have throw rugs and other things on the floor that can make you trip. What can I do with my stairs? Do not leave any items on the stairs. Make sure that there are handrails on both sides of the stairs and use them. Fix handrails that are broken or loose. Make sure that handrails are as long as the stairways. Check any  carpeting to make sure that it is firmly attached to the stairs. Fix any carpet that is loose or worn. Avoid having throw rugs at the top or bottom of the stairs. If you do have throw rugs, attach them to the floor with carpet tape. Make sure that you have a light switch at the top of the stairs and the bottom of the stairs. If you do not have them, ask someone to add them for you. What else can I do to help prevent falls? Wear shoes that: Do not have high heels. Have rubber bottoms. Are comfortable and fit you well. Are closed at the toe. Do not wear sandals. If you use a stepladder: Make sure that it is fully opened. Do not climb a closed stepladder. Make sure that both sides of the stepladder  are locked into place. Ask someone to hold it for you, if possible. Clearly mark and make sure that you can see: Any grab bars or handrails. First and last steps. Where the edge of each step is. Use tools that help you move around (mobility aids) if they are needed. These include: Canes. Walkers. Scooters. Crutches. Turn on the lights when you go into a dark area. Replace any light bulbs as soon as they burn out. Set up your furniture so you have a clear path. Avoid moving your furniture around. If any of your floors are uneven, fix them. If there are any pets around you, be aware of where they are. Review your medicines with your doctor. Some medicines can make you feel dizzy. This can increase your chance of falling. Ask your doctor what other things that you can do to help prevent falls. This information is not intended to replace advice given to you by your health care provider. Make sure you discuss any questions you have with your health care provider. Document Released: 08/29/2009 Document Revised: 04/09/2016 Document Reviewed: 12/07/2014 Elsevier Interactive Patient Education  2017 [employer].

## 2022-01-12 ENCOUNTER — Ambulatory Visit (INDEPENDENT_AMBULATORY_CARE_PROVIDER_SITE_OTHER): Payer: Medicare Other | Admitting: Family Medicine

## 2022-01-12 ENCOUNTER — Encounter: Payer: Self-pay | Admitting: Family Medicine

## 2022-01-12 VITALS — BP 98/60 | HR 62 | Temp 97.4°F | Ht 71.0 in | Wt 176.8 lb

## 2022-01-12 DIAGNOSIS — Z0000 Encounter for general adult medical examination without abnormal findings: Secondary | ICD-10-CM

## 2022-01-12 DIAGNOSIS — Z125 Encounter for screening for malignant neoplasm of prostate: Secondary | ICD-10-CM | POA: Diagnosis not present

## 2022-01-12 DIAGNOSIS — E559 Vitamin D deficiency, unspecified: Secondary | ICD-10-CM

## 2022-01-12 DIAGNOSIS — Z13220 Encounter for screening for lipoid disorders: Secondary | ICD-10-CM

## 2022-01-12 DIAGNOSIS — N401 Enlarged prostate with lower urinary tract symptoms: Secondary | ICD-10-CM

## 2022-01-12 DIAGNOSIS — R351 Nocturia: Secondary | ICD-10-CM

## 2022-01-12 NOTE — Progress Notes (Addendum)
Subjective:  Patient ID: Steven Cummings, male    DOB: [birth year]  Age: 66 y.o. MRN: [id number]  CC: Annual Exam   HPI Steven Cummings presents for complete physical. No new concerns today.  Depression screen Steven Cummings 2/9 01/12/2022 12/03/2021 01/02/2021  Decreased Interest 0 0 0  Down, Depressed, Hopeless 0 0 0  PHQ - 2 Score 0 0 0  Altered sleeping 0 - -  Tired, decreased energy 0 - -  Change in appetite 0 - -  Feeling bad or failure about yourself  0 - -  Trouble concentrating 0 - -  Moving slowly or fidgety/restless 0 - -  Suicidal thoughts 0 - -  PHQ-9 Score 0 - -  Difficult doing work/chores - - -    History Steven Cummings has a past medical history of Allergy and CRPS (complex regional pain syndrome), upper limb.   Steven Cummings has a past surgical history that includes Cardiac catheterization (2003); Tonsilectomy/adenoidectomy with myringotomy (1960); Knee surgery (Left, 1998); and Shoulder arthroscopy with rotator cuff repair (Right, 2017).   His family history includes Arthritis in his maternal grandfather, maternal grandmother, paternal grandfather, and paternal grandmother; Heart disease in his father and paternal grandfather; Hyperlipidemia in his father and mother; Hypertension in his father and mother; Kidney disease in his father; Stroke in his father.Steven Cummings reports that Steven Cummings quit smoking about 36 years ago. His smoking use included cigarettes. Steven Cummings has a 14.00 pack-year smoking history. Steven Cummings has never used smokeless tobacco. Steven Cummings reports current alcohol use of about 3.0 standard drinks per week. Steven Cummings reports that Steven Cummings does not use drugs.    ROS Review of Systems  Constitutional: Negative.   HENT: Negative.    Eyes:  Negative for visual disturbance.  Respiratory:  Negative for cough and shortness of breath.   Cardiovascular:  Negative for chest pain and leg swelling.  Gastrointestinal:  Negative for abdominal pain, diarrhea, nausea and vomiting.  Genitourinary:  Positive for frequency (up to  bathroom 2-3 times a night). Negative for difficulty urinating.  Musculoskeletal:  Positive for arthralgias (CRPS right  shoulder will flare lasts 2-3 days. relief withh rest). Negative for myalgias.  Skin:  Negative for rash.  Neurological:  Negative for headaches.  Psychiatric/Behavioral:  Negative for sleep disturbance.    Objective:  BP 98/60    Pulse 62    Temp (!) 97.4 F (36.3 C)    Ht '5\' 11"'  (1.803 m)    Wt 176 lb 12.8 oz (80.2 kg)    SpO2 96%    BMI 24.66 kg/m   BP Readings from Last 3 Encounters:  01/12/22 98/60  01/02/21 109/64  04/09/20 108/64    Wt Readings from Last 3 Encounters:  01/12/22 176 lb 12.8 oz (80.2 kg)  12/03/21 170 lb (77.1 kg)  01/02/21 180 lb 6 oz (81.8 kg)     Physical Exam Constitutional:      Appearance: Steven Cummings is well-developed.  HENT:     Head: Normocephalic and atraumatic.  Eyes:     Pupils: Pupils are equal, round, and reactive to light.  Neck:     Thyroid: No thyromegaly.     Trachea: No tracheal deviation.  Cardiovascular:     Rate and Rhythm: Normal rate and regular rhythm.     Heart sounds: Normal heart sounds. No murmur heard.   No friction rub. No gallop.  Pulmonary:     Breath sounds: Normal breath sounds. No wheezing or rales.  Abdominal:     General: Bowel  sounds are normal. There is no distension.     Palpations: Abdomen is soft. There is no mass.     Tenderness: There is no abdominal tenderness.     Hernia: There is no hernia in the left inguinal area.  Genitourinary:    Penis: Normal.      Testes: Normal.  Musculoskeletal:        General: Normal range of motion.     Cervical back: Normal range of motion.  Lymphadenopathy:     Cervical: No cervical adenopathy.  Skin:    General: Skin is warm and dry.  Neurological:     Mental Status: Steven Cummings is alert and oriented to person, place, and time.      Assessment & Plan:   Steven Cummings was seen today for annual exam.  Diagnoses and all orders for this visit:  Benign  prostatic hyperplasia with nocturia -     CBC with Differential/Platelet -     CMP14+EGFR -     PSA, total and free  Vitamin D deficiency -     VITAMIN D 25 Hydroxy (Vit-D Deficiency, Fractures)  Screening for prostate cancer -     PSA, total and free  Screening for cholesterol level -     CMP14+EGFR -     Lipid panel  Well adult exam       I am having Steven ALowella Petties "Steven Cummings" maintain his multivitamin with minerals, aspirin EC, Vitamin D (Ergocalciferol), EPINEPHrine, fluticasone, and Vitamin D3.  Allergies as of 01/12/2022       Reactions   Sulfa Antibiotics Rash        Medication List        Accurate as of January 12, 2022  7:46 PM. If you have any questions, ask your nurse or doctor.          aspirin EC 81 MG tablet Take 81 mg by mouth.   EPINEPHrine 0.3 mg/0.3 mL Soaj injection Commonly known as: EPI-PEN Inject 0.3 mg into the muscle as needed for anaphylaxis.   fluticasone 50 MCG/ACT nasal spray Commonly known as: FLONASE SPRAY 2 SPRAYS INTO EACH NOSTRIL EVERY DAY   multivitamin with minerals tablet Take by mouth.   Vitamin D (Ergocalciferol) 1.25 MG (50000 UNIT) Caps capsule Commonly known as: DRISDOL Take 50,000 Units by mouth every 7 (seven) days.   Vitamin D3 50 MCG (2000 UT) Tabs         Follow-up: Return in about 1 year (around 01/12/2023).  Steven Cummings, M.D.

## 2022-01-13 LAB — CMP14+EGFR
ALT: 16 IU/L (ref 0–44)
AST: 22 IU/L (ref 0–40)
Albumin/Globulin Ratio: 2.1 (ref 1.2–2.2)
Albumin: 4.8 g/dL (ref 3.8–4.8)
Alkaline Phosphatase: 99 IU/L (ref 44–121)
BUN/Creatinine Ratio: 20 (ref 10–24)
BUN: 17 mg/dL (ref 8–27)
Bilirubin Total: 1.2 mg/dL (ref 0.0–1.2)
CO2: 24 mmol/L (ref 20–29)
Calcium: 9.2 mg/dL (ref 8.6–10.2)
Chloride: 98 mmol/L (ref 96–106)
Creatinine, Ser: 0.87 mg/dL (ref 0.76–1.27)
Globulin, Total: 2.3 g/dL (ref 1.5–4.5)
Glucose: 93 mg/dL (ref 70–99)
Potassium: 4.6 mmol/L (ref 3.5–5.2)
Sodium: 136 mmol/L (ref 134–144)
Total Protein: 7.1 g/dL (ref 6.0–8.5)
eGFR: 95 mL/min/{1.73_m2} (ref >59)

## 2022-01-13 LAB — PSA, TOTAL AND FREE
PSA, Free Pct: 24.3 %
PSA, Free: 0.17 ng/mL
Prostate Specific Ag, Serum: 0.7 ng/mL (ref 0.0–4.0)

## 2022-01-13 LAB — CBC WITH DIFFERENTIAL/PLATELET
Basophils Absolute: 0 10*3/uL (ref 0.0–0.2)
Basos: 1 %
EOS (ABSOLUTE): 0.1 10*3/uL (ref 0.0–0.4)
Eos: 3 %
Hematocrit: 40.7 % (ref 37.5–51.0)
Hemoglobin: 14.1 g/dL (ref 13.0–17.7)
Immature Grans (Abs): 0 10*3/uL (ref 0.0–0.1)
Immature Granulocytes: 0 %
Lymphocytes Absolute: 1.1 10*3/uL (ref 0.7–3.1)
Lymphs: 31 %
MCH: 32.4 pg (ref 26.6–33.0)
MCHC: 34.6 g/dL (ref 31.5–35.7)
MCV: 94 fL (ref 79–97)
Monocytes Absolute: 0.5 10*3/uL (ref 0.1–0.9)
Monocytes: 13 %
Neutrophils Absolute: 1.9 10*3/uL (ref 1.4–7.0)
Neutrophils: 52 %
Platelets: 169 10*3/uL (ref 150–450)
RBC: 4.35 x10E6/uL (ref 4.14–5.80)
RDW: 12.1 % (ref 11.6–15.4)
WBC: 3.6 10*3/uL (ref 3.4–10.8)

## 2022-01-13 LAB — LIPID PANEL
Chol/HDL Ratio: 3.5 ratio (ref 0.0–5.0)
Cholesterol, Total: 168 mg/dL (ref 100–199)
HDL: 48 mg/dL (ref >39)
LDL Chol Calc (NIH): 107 mg/dL — ABNORMAL HIGH (ref 0–99)
Triglycerides: 68 mg/dL (ref 0–149)
VLDL Cholesterol Cal: 13 mg/dL (ref 5–40)

## 2022-01-13 LAB — VITAMIN D 25 HYDROXY (VIT D DEFICIENCY, FRACTURES): Vit D, 25-Hydroxy: 51.6 ng/mL (ref 30.0–100.0)

## 2022-01-13 NOTE — Progress Notes (Signed)
Hello Steven Cummings,  Your lab result is normal and/or stable.Some minor variations that are not significant are commonly marked abnormal, but do not represent any medical problem for you.  Best regards, Janssen Zee, M.D.

## 2022-01-29 IMAGING — US US ABDOMINAL AORTA SCREENING AAA
1 series · 14 of 23 positions shown · non-contrast
Comparison: None.

CLINICAL DATA: Male between 65-75 years of age with a smoking
history.

EXAM:
US ABDOMINAL AORTA MEDICARE SCREENING
TECHNIQUE: Ultrasound examination of the abdominal aorta was performed as a
screening evaluation for abdominal aortic aneurysm.

[Series 1: us aorta medicare screening · 14 of 23 slices shown]
[im 1/23]
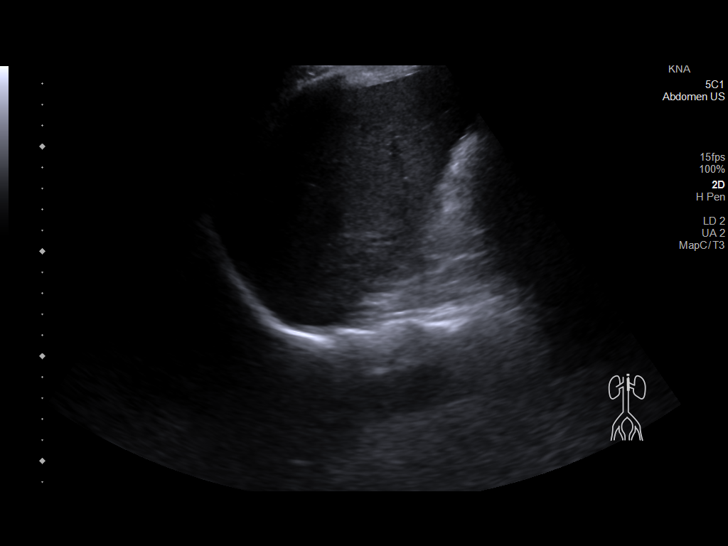
[im 3/23]
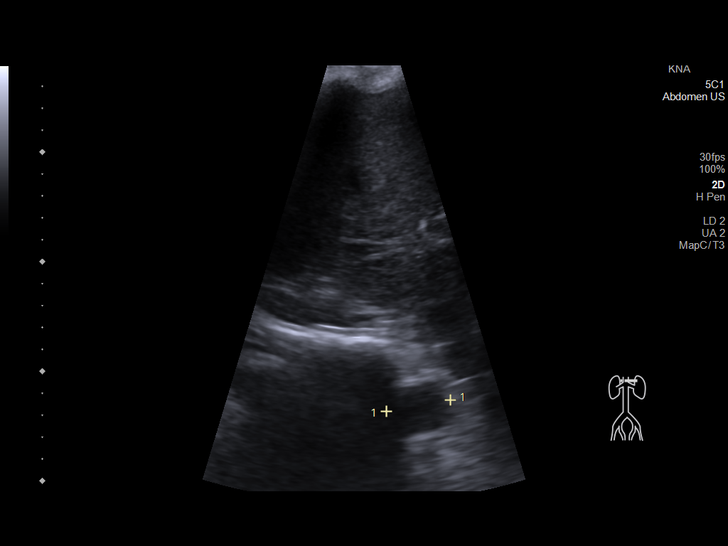
[im 5/23]
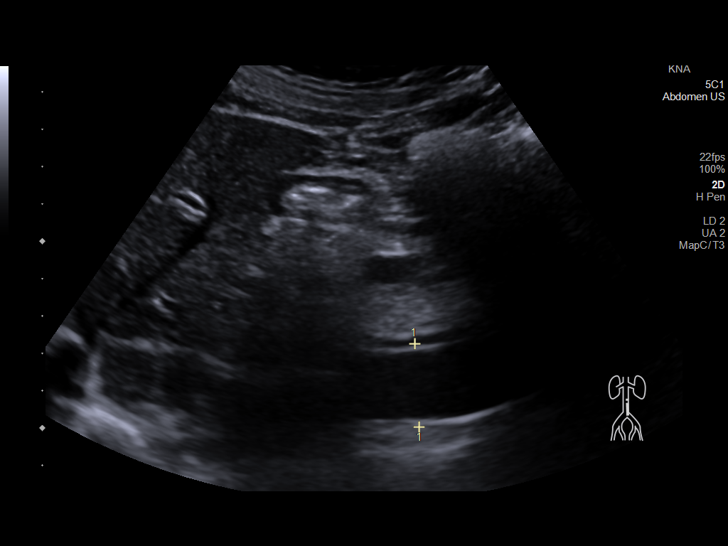
[im 6/23]
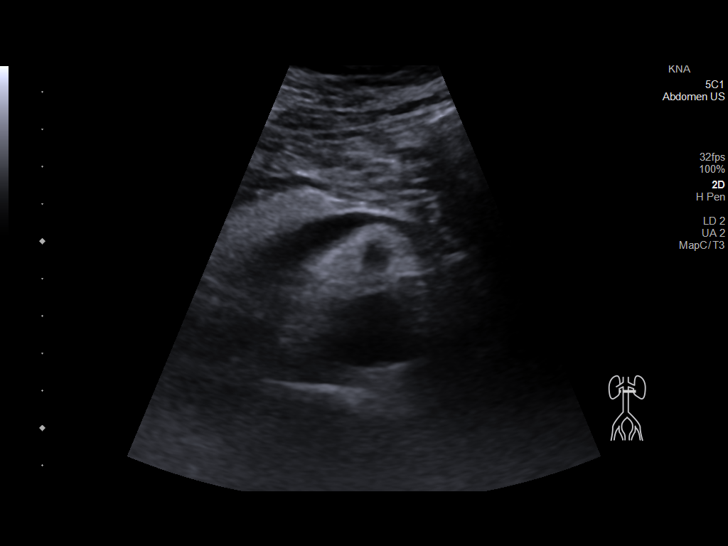
[im 8/23]
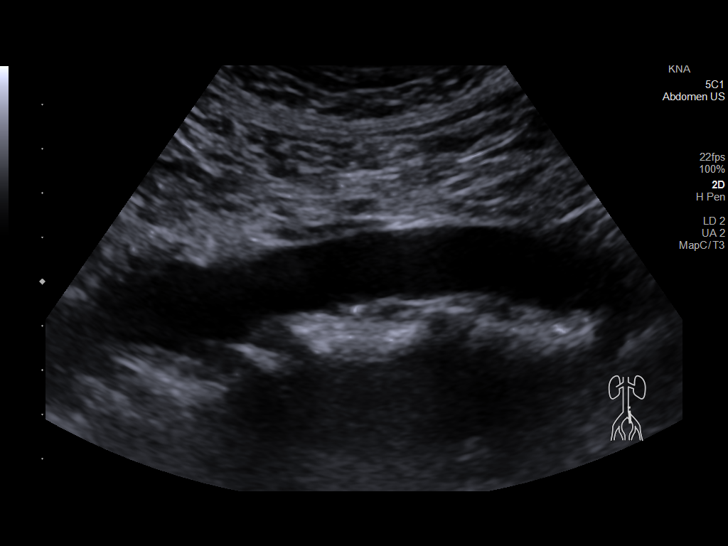
[im 10/23]
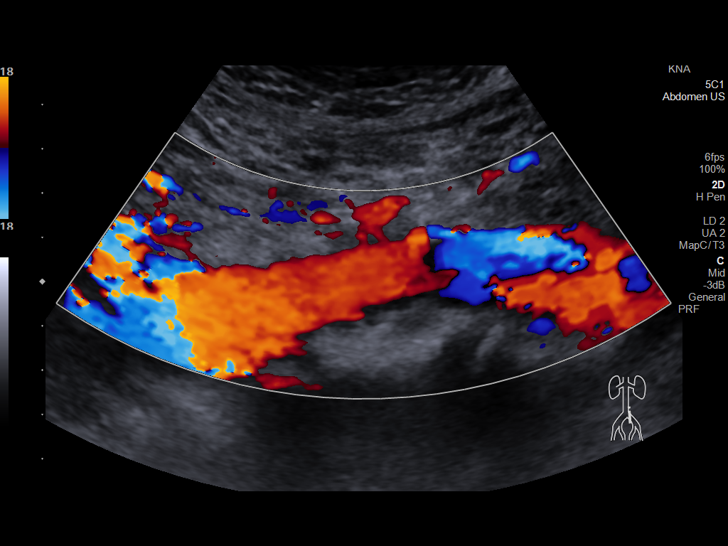
[im 11/23]
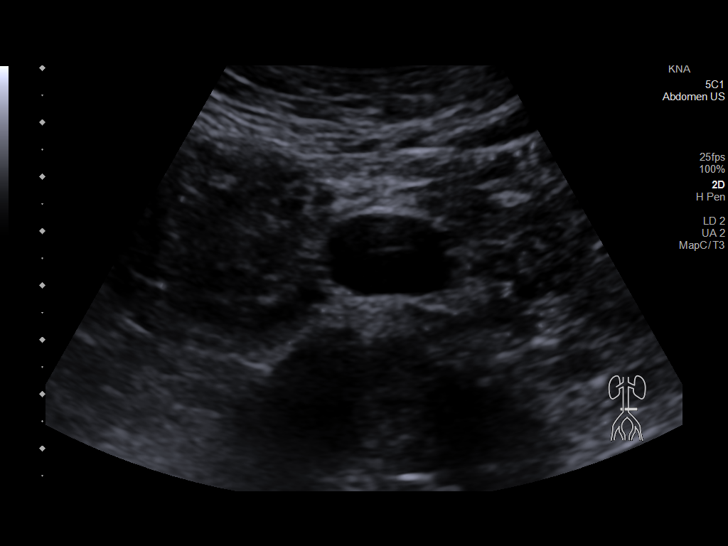
[im 13/23]
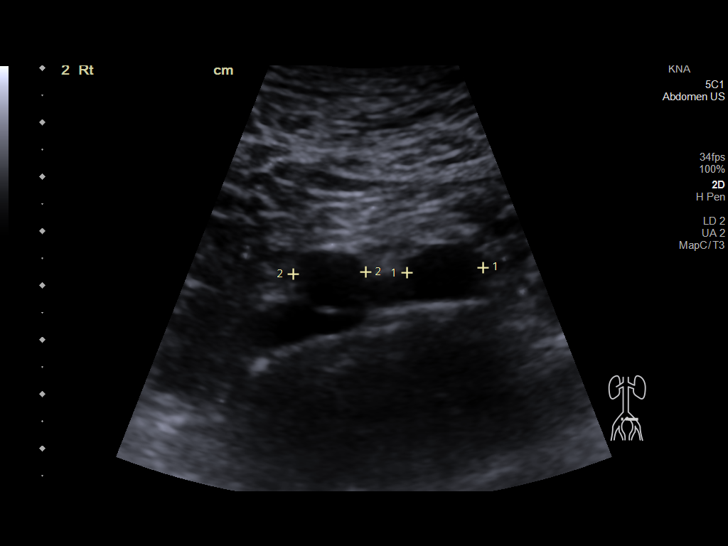
[im 14/23]
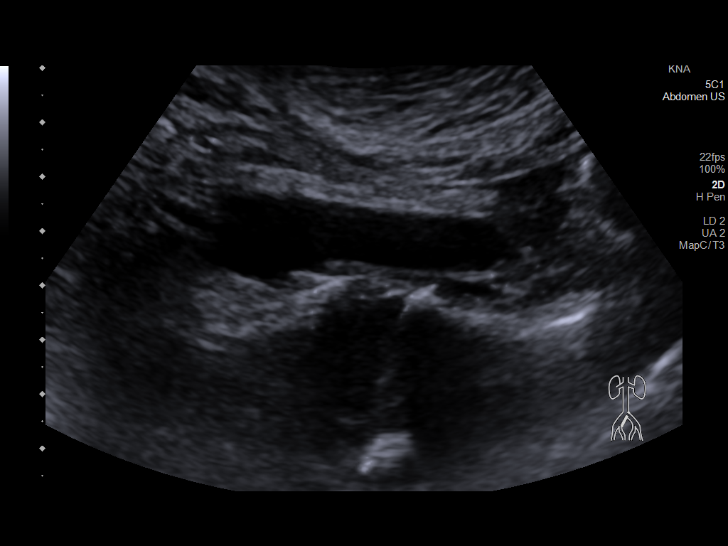
[im 16/23]
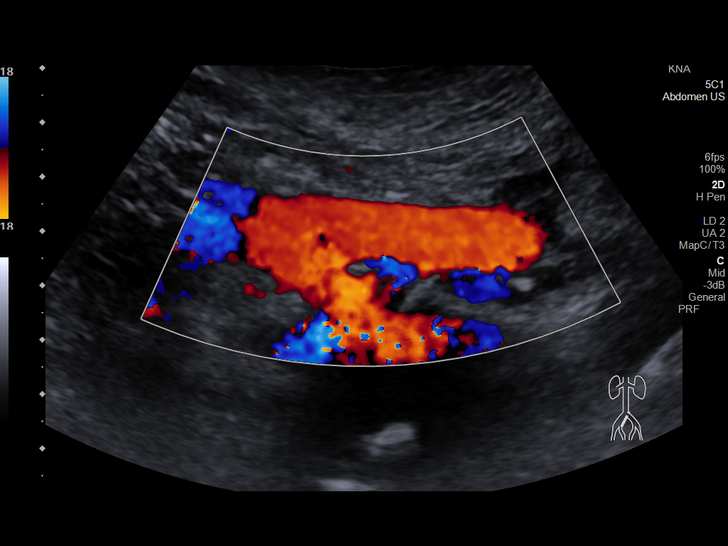
[im 18/23]
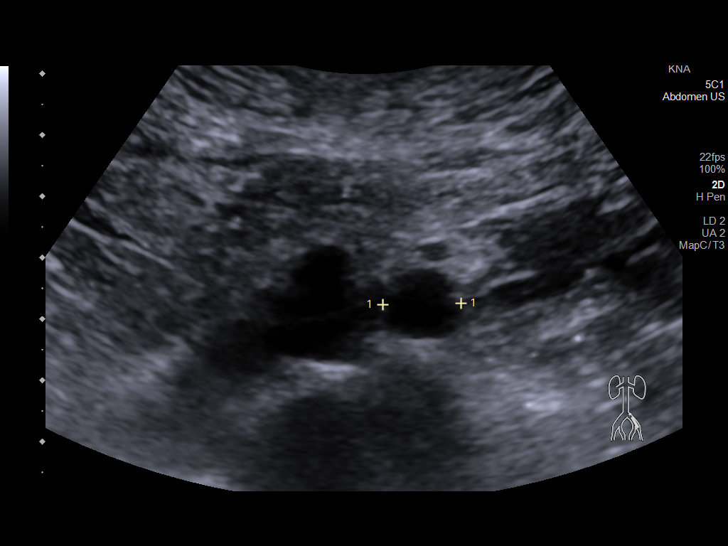
[im 19/23]
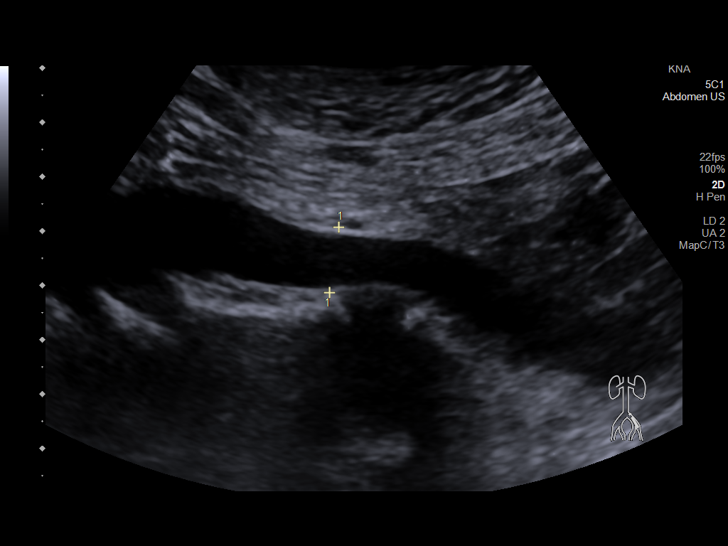
[im 21/23]
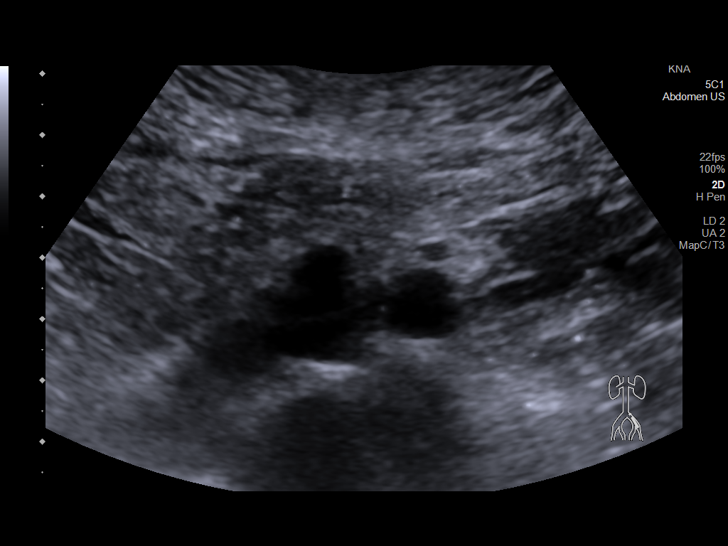
[im 23/23]
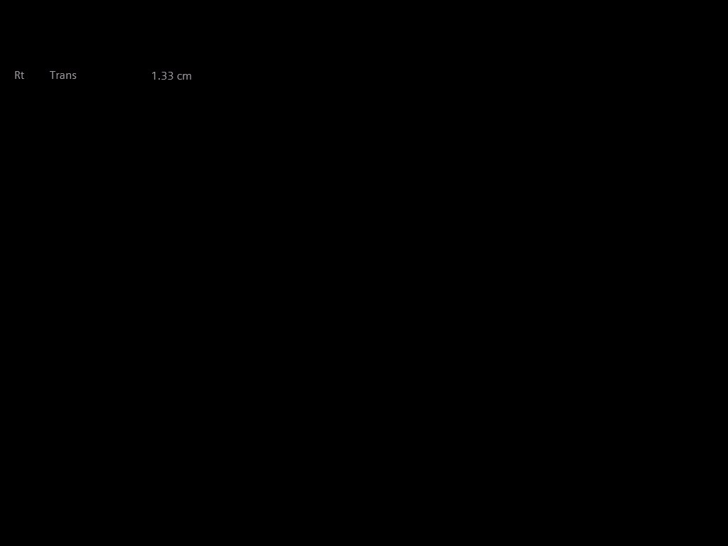

[14 of 23 positions shown; findings below may reference images not displayed]

FINDINGS: Abdominal aortic measurements as follows:

Proximal:  2.6 x 2.9 cm

Mid:  2.2 x 2.5 cm

Distal:  1.9 x 2.2 cm

No significant plaque visualized. Proximal common iliac artery
origins are normal in caliber.
IMPRESSION: No evidence of abdominal aortic aneurysm.

## 2022-12-09 ENCOUNTER — Ambulatory Visit (INDEPENDENT_AMBULATORY_CARE_PROVIDER_SITE_OTHER): Payer: Medicare Other

## 2022-12-09 VITALS — Ht 71.0 in | Wt 175.0 lb

## 2022-12-09 DIAGNOSIS — Z0000 Encounter for general adult medical examination without abnormal findings: Secondary | ICD-10-CM | POA: Diagnosis not present

## 2022-12-09 NOTE — Progress Notes (Signed)
Subjective:   Steven Cummings is a 67 y.o. male who presents for Medicare Annual/Subsequent preventive examination.  I connected with  Steven Cummings on 12/09/22 by a audio enabled telemedicine application and verified that I am speaking with the correct person using two identifiers.  Patient Location: Home  Provider Location: Home Office  I discussed the limitations of evaluation and management by telemedicine. The patient expressed understanding and agreed to proceed.  Review of Systems     Cardiac Risk Factors include: advanced age (>67men, >54 women);male gender     Objective:    Today's Vitals   12/09/22 1530  Weight: 175 lb (79.4 kg)  Height: 5\' 11"  (1.803 m)   Body mass index is 24.41 kg/m.     12/09/2022    3:28 PM 12/03/2021    4:12 PM  Advanced Directives  Does Patient Have a Medical Advance Directive? Yes Yes  Type of Paramedic of [place];Living will Steven Cummings;Living will  Does patient want to make changes to medical advance directive? No - Patient declined   Copy of [place] in Chart? No - copy requested No - copy requested    Current Medications (verified) Outpatient Encounter Medications as of 12/09/2022  Medication Sig   aspirin EC 81 MG tablet Take 81 mg by mouth.   Cholecalciferol (VITAMIN D3) 50 MCG (2000 UT) TABS    EPINEPHrine 0.3 mg/0.3 mL IJ SOAJ injection Inject 0.3 mg into the muscle as needed for anaphylaxis.   fluticasone (FLONASE) 50 MCG/ACT nasal spray SPRAY 2 SPRAYS INTO EACH NOSTRIL EVERY DAY   Multiple Vitamins-Minerals (MULTIVITAMIN WITH MINERALS) tablet Take by mouth.   Vitamin D, Ergocalciferol, (DRISDOL) 1.25 MG (50000 UNIT) CAPS capsule Take 50,000 Units by mouth every 7 (seven) days.   No facility-administered encounter medications on file as of 12/09/2022.    Allergies (verified) Sulfa antibiotics   History: Past Medical History:  Diagnosis Date    Allergy    CRPS (complex regional pain syndrome), upper limb    Past Surgical History:  Procedure Laterality Date   CARDIAC CATHETERIZATION  2003   KNEE SURGERY Left 1998   SHOULDER ARTHROSCOPY WITH ROTATOR CUFF REPAIR Right 2017   TONSILECTOMY/ADENOIDECTOMY WITH MYRINGOTOMY  1960   Family History  Problem Relation Age of Onset   Hyperlipidemia Mother    Hypertension Mother    Heart disease Father    Hyperlipidemia Father    Hypertension Father    Kidney disease Father    Stroke Father    Arthritis Maternal Grandmother    Arthritis Maternal Grandfather    Arthritis Paternal Grandmother    Arthritis Paternal Grandfather    Heart disease Paternal Grandfather    Social History   Socioeconomic History   Marital status: Married    Spouse name: Not on file   Number of children: 2   Years of education: Not on file   Highest education level: Not on file  Occupational History   Occupation: retired since age 15    Comment: business owner, 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, etc  Tobacco Use   Smoking status: Former    Packs/day: 1.00    Years: 14.00    Total pack years: 14.00    Types: Cigarettes    Quit date: 12/14/1985    Years since quitting: 37.0   Smokeless tobacco: Never  Vaping Use   Vaping Use: Never used  Substance and Sexual Activity   Alcohol use: Yes  Alcohol/week: 3.0 standard drinks of alcohol    Types: 3 Cans of beer per week   Drug use: No   Sexual activity: Yes  Other Topics Concern   Not on file  Social History Narrative   1 son in [place], 1 daughter 2 miles away, 3 grandsons - they all spend lots of time together   Father living in [place] with dementia   2 story home with wife - very active 15,000-20,000 steps per day   Social Determinants of Health   Financial Resource Strain: Low Risk  (12/09/2022)   Overall Financial Resource Strain (CARDIA)    Difficulty of Paying Living Expenses: Not hard at all  Food Insecurity: No Food Insecurity (12/09/2022)    Hunger Vital Sign    Worried About Running Out of Food in the Last Year: Never true    [place] in the Last Year: Never true  Transportation Needs: No Transportation Needs (12/09/2022)   PRAPARE - Hydrologist (Medical): No    Lack of Transportation (Non-Medical): No  Physical Activity: Sufficiently Active (12/09/2022)   Exercise Vital Sign    Days of Exercise per Week: 7 days    Minutes of Exercise per Session: 40 min  Stress: No Stress Concern Present (12/09/2022)   [place]    Feeling of Stress : Not at all  Social Connections: Moderately Integrated (12/09/2022)   Social Connection and Isolation Panel [NHANES]    Frequency of Communication with Friends and Family: More than three times a week    Frequency of Social Gatherings with Friends and Family: Three times a week    Attends Religious Services: Never    Active Member of Clubs or Organizations: Yes    Attends Music therapist: More than 4 times per year    Marital Status: Married    Tobacco Counseling Counseling given: Not Answered   Clinical Intake:  Pre-visit preparation completed: Yes  Pain : No/denies pain   Diabetes: No  How often do you need to have someone help you when you read instructions, pamphlets, or other written materials from your doctor or pharmacy?: 1 - Never  Diabetic?No   Interpreter Needed?: No  Information entered by :: Denman George LPN   Activities of Daily Living    12/09/2022    3:29 PM  In your present state of health, do you have any difficulty performing the following activities:  Hearing? 0  Vision? 0  Difficulty concentrating or making decisions? 0  Walking or climbing stairs? 0  Dressing or bathing? 0  Doing errands, shopping? 0  Preparing Food and eating ? N  Using the Toilet? N  In the past six months, have you accidently leaked urine? N  Do you have  problems with loss of bowel control? N  Managing your Medications? N  Managing your Finances? N  Housekeeping or managing your Housekeeping? N    Patient Care Team: Claretta Fraise, MD as PCP - General (Family Medicine) Leticia Clas, OD (Optometry)  Indicate any recent Medical Services you may have received from other than [facility] providers in the past year (date may be approximate).     Assessment:   This is a routine wellness examination for Steven Cummings.  Hearing/Vision screen Hearing Screening - Comments:: Denies hearing difficulties  Vision Screening - Comments::  up to date with routine eye exams with Dr. Leticia Clas    Dietary issues and  exercise activities discussed: Current Exercise Habits: Home exercise routine, Type of exercise: walking, Time (Minutes): 30, Frequency (Times/Week): 5, Weekly Exercise (Minutes/Week): 150, Intensity: Mild   Goals Addressed   None   Depression Screen    12/09/2022    3:33 PM 01/12/2022    9:03 AM 12/03/2021    4:13 PM 01/02/2021    9:22 AM 04/09/2020    8:50 AM 12/20/2019   10:57 AM 12/14/2018    9:16 AM  PHQ 2/9 Scores  PHQ - 2 Score 0 0 0 0 0 0 0  PHQ- 9 Score  0         Fall Risk    12/09/2022    3:32 PM 01/12/2022    9:03 AM 12/03/2021    4:14 PM 01/02/2021    9:21 AM 04/09/2020    8:46 AM  Fall Risk   Falls in the past year? 0 0 0 0 0  Number falls in past yr: 0 0 0    Injury with Fall? 0 0 0    Risk for fall due to :  No Fall Risks No Fall Risks    Follow up Falls prevention discussed;Education provided;Falls evaluation completed  Falls prevention discussed Falls evaluation completed     FALL RISK PREVENTION PERTAINING TO THE HOME:  Any stairs in or around the home? Yes  If so, are there any without handrails? No  Home free of loose throw rugs in walkways, pet beds, electrical cords, etc? Yes  Adequate lighting in your home to reduce risk of falls? Yes   ASSISTIVE DEVICES UTILIZED TO PREVENT FALLS:  Life alert? No  Use  of a cane, walker or w/c? No  Grab bars in the bathroom? Yes  Shower chair or bench in shower? No  Elevated toilet seat or a handicapped toilet? Yes   TIMED UP AND GO:  Was the test performed? No . Telephonic visit   Cognitive Function:        12/09/2022    3:29 PM 12/03/2021    4:15 PM  6CIT Screen  What Year? 0 points 0 points  What month? 0 points 0 points  What time? 0 points 0 points  Count back from 20 0 points 0 points  Months in reverse 0 points 0 points  Repeat phrase 0 points 0 points  Total Score 0 points 0 points    Immunizations Immunization History  Administered Date(s) Administered   COVID-19, mRNA, vaccine(Comirnaty)12 years and older 11/03/2022   Fluad Quad(high Dose 65+) 08/21/2020, 08/16/2022   Influenza,inj,Quad PF,6+ Mos 08/15/2018, 08/10/2019   Influenza-Unspecified 08/24/2017   Moderna Covid-19 Vaccine Bivalent Booster 27yrs & up 08/22/2021   Moderna Sars-Covid-2 Vaccination 01/11/2020, 02/09/2020, 09/17/2020   Td 09/08/2016   Tdap 09/08/2016    TDAP status: Up to date  Flu Vaccine status: Up to date  Pneumococcal vaccine status: Due, Education has been provided regarding the importance of this vaccine. Advised may receive this vaccine at local pharmacy or Health Dept. Aware to provide a copy of the vaccination record if obtained from local pharmacy or Health Dept. Verbalized acceptance and understanding.  Covid-19 vaccine status: Information provided on how to obtain vaccines.   Qualifies for Shingles Vaccine? Yes   Zostavax completed No   Shingrix Completed?: No.    Education has been provided regarding the importance of this vaccine. Patient has been advised to call insurance company to determine out of pocket expense if they have not yet received this vaccine. Advised may also  receive vaccine at local pharmacy or Health Dept. Verbalized acceptance and understanding.  Screening Tests Health Maintenance  Topic Date Due   Zoster Vaccines-  Shingrix (1 of 2) Never done   Pneumonia Vaccine 48+ Years old (1 - PCV) Never done   Medicare Annual Wellness (AWV)  12/10/2023   DTaP/Tdap/Td (3 - Td or Tdap) 09/08/2026   COLONOSCOPY (Pts 45-68yrs Insurance coverage will need to be confirmed)  12/10/2026   INFLUENZA VACCINE  Completed   COVID-19 Vaccine  Completed   Hepatitis C Screening  Completed   HPV VACCINES  Aged Out    Health Maintenance  Health Maintenance Due  Topic Date Due   Zoster Vaccines- Shingrix (1 of 2) Never done   Pneumonia Vaccine 14+ Years old (1 - PCV) Never done    Colorectal cancer screening: Type of screening: Colonoscopy. Completed 12/10/16. Repeat every 10 years  Lung Cancer Screening: (Low Dose CT Chest recommended if Age 16-80 years, 30 pack-year currently smoking OR have quit w/in 15years.) does not qualify.   Lung Cancer Screening Referral: n/a   Additional Screening:  Hepatitis C Screening: does qualify; Completed 12/14/17  Vision Screening: Recommended annual ophthalmology exams for early detection of glaucoma and other disorders of the eye. Is the patient up to date with their annual eye exam?  Yes  Who is the provider or what is the name of the office in which the patient attends annual eye exams? Dr. Leticia Clas If pt is not established with a provider, would they like to be referred to a provider to establish care? No .   Dental Screening: Recommended annual dental exams for proper oral hygiene  Community Resource Referral / Chronic Care Management: CRR required this visit?  No   CCM required this visit?  No      Plan:     I have personally reviewed and noted the following in the patient's chart:   Medical and social history Use of alcohol, tobacco or illicit drugs  Current medications and supplements including opioid prescriptions. Patient is not currently taking opioid prescriptions. Functional ability and status Nutritional status Physical activity Advanced  directives List of other physicians Hospitalizations, surgeries, and ER visits in previous 12 months Vitals Screenings to include cognitive, depression, and falls Referrals and appointments  In addition, I have reviewed and discussed with patient certain preventive protocols, quality metrics, and best practice recommendations. A written personalized care plan for preventive services as well as general preventive health recommendations were provided to patient.     Vanetta Mulders, [place]   9/79/8921   Due to this being a virtual visit, the after visit summary with patients personalized plan was offered to patient via mail or my-chart.  Patient would like to access on my-chart  Nurse Notes: No concerns

## 2022-12-09 NOTE — Patient Instructions (Addendum)
Steven Cummings , Thank you for taking time to come for your Medicare Wellness Visit. I appreciate your ongoing commitment to your health goals. Please review the following plan we discussed and let me know if I can assist you in the future.   These are the goals we discussed:  Goals      Patient Stated     Stay healthy and active        This is a list of the screening recommended for you and due dates:  Health Maintenance  Topic Date Due   Zoster (Shingles) Vaccine (1 of 2) Never done   Pneumonia Vaccine (1 - PCV) Never done   Medicare Annual Wellness Visit  12/10/2023   DTaP/Tdap/Td vaccine (3 - Td or Tdap) 09/08/2026   Colon Cancer Screening  12/10/2026   Flu Shot  Completed   COVID-19 Vaccine  Completed   Hepatitis C Screening: USPSTF Recommendation to screen - Ages 18-79 yo.  Completed   HPV Vaccine  Aged Out    Advanced directives: Please bring a copy of your health care power of attorney and living will to the office to be added to your chart at your convenience.   Conditions/risks identified: Aim for 30 minutes of exercise or brisk walking, 6-8 glasses of water, and 5 servings of fruits and vegetables each day.   Next appointment: Follow up in one year for your annual wellness visit.   Preventive Care 10 Years and Older, Male  Preventive care refers to lifestyle choices and visits with your health care provider that can promote health and wellness. What does preventive care include? A yearly physical exam. This is also called an annual well check. Dental exams once or twice a year. Routine eye exams. Ask your health care provider how often you should have your eyes checked. Personal lifestyle choices, including: Daily care of your teeth and gums. Regular physical activity. Eating a healthy diet. Avoiding tobacco and drug use. Limiting alcohol use. Practicing safe sex. Taking low doses of aspirin every day. Taking vitamin and mineral supplements as recommended by  your health care provider. What happens during an annual well check? The services and screenings done by your health care provider during your annual well check will depend on your age, overall health, lifestyle risk factors, and family history of disease. Counseling  Your health care provider may ask you questions about your: Alcohol use. Tobacco use. Drug use. Emotional well-being. Home and relationship well-being. Sexual activity. Eating habits. History of falls. Memory and ability to understand (cognition). Work and work Statistician. Screening  You may have the following tests or measurements: Height, weight, and BMI. Blood pressure. Lipid and cholesterol levels. These may be checked every 5 years, or more frequently if you are over 8 years old. Skin check. Lung cancer screening. You may have this screening every year starting at age 69 if you have a 30-pack-year history of smoking and currently smoke or have quit within the past 15 years. Fecal occult blood test (FOBT) of the stool. You may have this test every year starting at age 41. Flexible sigmoidoscopy or colonoscopy. You may have a sigmoidoscopy every 5 years or a colonoscopy every 10 years starting at age 73. Prostate cancer screening. Recommendations will vary depending on your family history and other risks. Hepatitis C blood test. Hepatitis B blood test. Sexually transmitted disease (STD) testing. Diabetes screening. This is done by checking your blood sugar (glucose) after you have not eaten for a while (fasting).  You may have this done every 1-3 years. Abdominal aortic aneurysm (AAA) screening. You may need this if you are a current or former smoker. Osteoporosis. You may be screened starting at age 13 if you are at high risk. Talk with your health care provider about your test results, treatment options, and if necessary, the need for more tests. Vaccines  Your health care provider may recommend certain vaccines,  such as: Influenza vaccine. This is recommended every year. Tetanus, diphtheria, and acellular pertussis (Tdap, Td) vaccine. You may need a Td booster every 10 years. Zoster vaccine. You may need this after age 17. Pneumococcal 13-valent conjugate (PCV13) vaccine. One dose is recommended after age 22. Pneumococcal polysaccharide (PPSV23) vaccine. One dose is recommended after age 64. Talk to your health care provider about which screenings and vaccines you need and how often you need them. This information is not intended to replace advice given to you by your health care provider. Make sure you discuss any questions you have with your health care provider. Document Released: 11/29/2015 Document Revised: 07/22/2016 Document Reviewed: 09/03/2015 Elsevier Interactive Patient Education  2017 [place] Prevention in the Home Falls can cause injuries. They can happen to people of all ages. There are many things you can do to make your home safe and to help prevent falls. What can I do on the outside of my home? Regularly fix the edges of walkways and driveways and fix any cracks. Remove anything that might make you trip as you walk through a door, such as a raised step or threshold. Trim any bushes or trees on the path to your home. Use bright outdoor lighting. Clear any walking paths of anything that might make someone trip, such as rocks or tools. Regularly check to see if handrails are loose or broken. Make sure that both sides of any steps have handrails. Any raised decks and porches should have guardrails on the edges. Have any leaves, snow, or ice cleared regularly. Use sand or salt on walking paths during winter. Clean up any spills in your garage right away. This includes oil or grease spills. What can I do in the bathroom? Use night lights. Install grab bars by the toilet and in the tub and shower. Do not use towel bars as grab bars. Use non-skid mats or decals in the tub or  shower. If you need to sit down in the shower, use a plastic, non-slip stool. Keep the floor dry. Clean up any water that spills on the floor as soon as it happens. Remove soap buildup in the tub or shower regularly. Attach bath mats securely with double-sided non-slip rug tape. Do not have throw rugs and other things on the floor that can make you trip. What can I do in the bedroom? Use night lights. Make sure that you have a light by your bed that is easy to reach. Do not use any sheets or blankets that are too big for your bed. They should not hang down onto the floor. Have a firm chair that has side arms. You can use this for support while you get dressed. Do not have throw rugs and other things on the floor that can make you trip. What can I do in the kitchen? Clean up any spills right away. Avoid walking on wet floors. Keep items that you use a lot in easy-to-reach places. If you need to reach something above you, use a strong step stool that has a grab bar. Keep  electrical cords out of the way. Do not use floor polish or wax that makes floors slippery. If you must use wax, use non-skid floor wax. Do not have throw rugs and other things on the floor that can make you trip. What can I do with my stairs? Do not leave any items on the stairs. Make sure that there are handrails on both sides of the stairs and use them. Fix handrails that are broken or loose. Make sure that handrails are as long as the stairways. Check any carpeting to make sure that it is firmly attached to the stairs. Fix any carpet that is loose or worn. Avoid having throw rugs at the top or bottom of the stairs. If you do have throw rugs, attach them to the floor with carpet tape. Make sure that you have a light switch at the top of the stairs and the bottom of the stairs. If you do not have them, ask someone to add them for you. What else can I do to help prevent falls? Wear shoes that: Do not have high heels. Have  rubber bottoms. Are comfortable and fit you well. Are closed at the toe. Do not wear sandals. If you use a stepladder: Make sure that it is fully opened. Do not climb a closed stepladder. Make sure that both sides of the stepladder are locked into place. Ask someone to hold it for you, if possible. Clearly mark and make sure that you can see: Any grab bars or handrails. First and last steps. Where the edge of each step is. Use tools that help you move around (mobility aids) if they are needed. These include: Canes. Walkers. Scooters. Crutches. Turn on the lights when you go into a dark area. Replace any light bulbs as soon as they burn out. Set up your furniture so you have a clear path. Avoid moving your furniture around. If any of your floors are uneven, fix them. If there are any pets around you, be aware of where they are. Review your medicines with your doctor. Some medicines can make you feel dizzy. This can increase your chance of falling. Ask your doctor what other things that you can do to help prevent falls. This information is not intended to replace advice given to you by your health care provider. Make sure you discuss any questions you have with your health care provider. Document Released: 08/29/2009 Document Revised: 04/09/2016 Document Reviewed: 12/07/2014 Elsevier Interactive Patient Education  2017 [employer].

## 2023-01-13 ENCOUNTER — Ambulatory Visit: Payer: Medicare Other | Admitting: Family Medicine

## 2023-01-14 ENCOUNTER — Encounter: Payer: Self-pay | Admitting: Family Medicine

## 2023-01-14 ENCOUNTER — Ambulatory Visit (INDEPENDENT_AMBULATORY_CARE_PROVIDER_SITE_OTHER): Payer: Medicare Other | Admitting: Family Medicine

## 2023-01-14 VITALS — BP 99/57 | HR 54 | Temp 97.3°F | Ht 71.0 in | Wt 180.6 lb

## 2023-01-14 DIAGNOSIS — G5641 Causalgia of right upper limb: Secondary | ICD-10-CM

## 2023-01-14 DIAGNOSIS — Z13220 Encounter for screening for lipoid disorders: Secondary | ICD-10-CM

## 2023-01-14 DIAGNOSIS — E559 Vitamin D deficiency, unspecified: Secondary | ICD-10-CM | POA: Diagnosis not present

## 2023-01-14 DIAGNOSIS — N401 Enlarged prostate with lower urinary tract symptoms: Secondary | ICD-10-CM | POA: Insufficient documentation

## 2023-01-14 DIAGNOSIS — Z0000 Encounter for general adult medical examination without abnormal findings: Secondary | ICD-10-CM

## 2023-01-14 DIAGNOSIS — R351 Nocturia: Secondary | ICD-10-CM | POA: Diagnosis not present

## 2023-01-14 LAB — URINALYSIS
Bilirubin, UA: NEGATIVE
Glucose, UA: NEGATIVE
Ketones, UA: NEGATIVE
Leukocytes,UA: NEGATIVE
Nitrite, UA: NEGATIVE
Protein,UA: NEGATIVE
RBC, UA: NEGATIVE
Specific Gravity, UA: 1.01 (ref 1.005–1.030)
Urobilinogen, Ur: 0.2 mg/dL (ref 0.2–1.0)
pH, UA: 7 (ref 5.0–7.5)

## 2023-01-14 LAB — LIPID PANEL

## 2023-01-14 MED ORDER — HYDROCODONE-ACETAMINOPHEN 10-325 MG PO TABS: 1.0000 | ORAL_TABLET | ORAL | 0 refills | Status: DC | PRN

## 2023-01-14 MED ORDER — BETAMETHASONE SOD PHOS & ACET 6 (3-3) MG/ML IJ SUSP
6.0000 mg | Freq: Once | INTRAMUSCULAR | Status: AC
  Administered 2023-01-14: 6 mg via INTRAMUSCULAR

## 2023-01-14 NOTE — Addendum Note (Signed)
Addended by: Claretta Fraise on: 01/14/2023 06:14 PM   Modules accepted: Orders

## 2023-01-14 NOTE — Addendum Note (Signed)
Addended by: Christia Reading on: 01/14/2023 03:52 PM   Modules accepted: Orders

## 2023-01-14 NOTE — Progress Notes (Addendum)
Subjective:  Patient ID: Steven Cummings, male    DOB: [birth year]  Age: 67 y.o. MRN: [id number]  CC: Annual Exam   HPI NORIEL CLAYPOOL presents for CPE.  Has CRPS at the right shoulder. Flares 2 X a year. Usually lasts about a week. No relief with tylenol. Using Hydrocodone at night rarely. Was given a scrip 7 years ago. Has used that bottle sparingly and it has finally run out after all this time.  Sciatica at RLE causing pain that runs down the right posterior thigh. Worse at times with  laying down and stretching out. It will catch. May interfere with sleep. Currently ongoing for 2 weeks. Moderate to severe at times.      01/14/2023    8:14 AM 12/09/2022    3:33 PM 01/12/2022    9:03 AM  Depression screen PHQ 2/9  Decreased Interest 0 0 0  Down, Depressed, Hopeless 0 0 0  PHQ - 2 Score 0 0 0  Altered sleeping   0  Tired, decreased energy   0  Change in appetite   0  Feeling bad or failure about yourself    0  Trouble concentrating   0  Moving slowly or fidgety/restless   0  Suicidal thoughts   0  PHQ-9 Score   0    History Jeison has a past medical history of Allergy and CRPS (complex regional pain syndrome), upper limb.   He has a past surgical history that includes Cardiac catheterization (2003); Tonsilectomy/adenoidectomy with myringotomy (1960); Knee surgery (Left, 1998); and Shoulder arthroscopy with rotator cuff repair (Right, 2017).   His family history includes Arthritis in his maternal grandfather, maternal grandmother, paternal grandfather, and paternal grandmother; Heart disease in his father and paternal grandfather; Hyperlipidemia in his father and mother; Hypertension in his father and mother; Kidney disease in his father; Stroke in his father.He reports that he quit smoking about 37 years ago. His smoking use included cigarettes. He has a 14.00 pack-year smoking history. He has never used smokeless tobacco. He reports current alcohol use of about 3.0  standard drinks of alcohol per week. He reports that he does not use drugs.    ROS Review of Systems  Constitutional:  Negative for activity change, fatigue, fever and unexpected weight change.  HENT:  Negative for congestion, ear pain, hearing loss, postnasal drip and trouble swallowing.   Eyes:  Negative for pain and visual disturbance.  Respiratory:  Negative for cough, chest tightness and shortness of breath.   Cardiovascular:  Negative for chest pain, palpitations and leg swelling.  Gastrointestinal:  Negative for abdominal distention, abdominal pain, blood in stool, constipation, diarrhea, nausea and vomiting.  Endocrine: Negative for cold intolerance, heat intolerance and polydipsia.  Genitourinary:  Positive for frequency. Negative for difficulty urinating, dysuria, flank pain and urgency.  Musculoskeletal:  Positive for arthralgias. Negative for joint swelling.  Skin:  Negative for color change, rash and wound.  Neurological:  Negative for dizziness, syncope, speech difficulty, weakness, light-headedness, numbness and headaches.  Hematological:  Does not bruise/bleed easily.  Psychiatric/Behavioral:  Negative for confusion, decreased concentration, dysphoric mood and sleep disturbance. The patient is not nervous/anxious.     Objective:  BP (!) 99/57   Pulse (!) 54   Temp (!) 97.3 F (36.3 C)   Ht '5\' 11"'$  (1.803 m)   Wt 180 lb 9.6 oz (81.9 kg)   SpO2 100%   BMI 25.19 kg/m   BP Readings from Last 3 Encounters:  01/14/23 (!) 99/57  01/12/22 98/60  01/02/21 109/64    Wt Readings from Last 3 Encounters:  01/14/23 180 lb 9.6 oz (81.9 kg)  12/09/22 175 lb (79.4 kg)  01/12/22 176 lb 12.8 oz (80.2 kg)     Physical Exam Vitals reviewed.  Constitutional:      Appearance: He is well-developed.  HENT:     Head: Normocephalic and atraumatic.     Right Ear: External ear normal.     Left Ear: External ear normal.     Mouth/Throat:     Pharynx: No oropharyngeal exudate or  posterior oropharyngeal erythema.  Eyes:     Pupils: Pupils are equal, round, and reactive to light.  Neck:     Thyroid: No thyromegaly.     Trachea: No tracheal deviation.  Cardiovascular:     Rate and Rhythm: Normal rate and regular rhythm.     Heart sounds: Normal heart sounds. No murmur heard.    No friction rub. No gallop.  Pulmonary:     Effort: No respiratory distress.     Breath sounds: Normal breath sounds. No wheezing or rales.  Abdominal:     General: Bowel sounds are normal. There is no distension.     Palpations: Abdomen is soft. There is no mass.     Tenderness: There is no abdominal tenderness.     Hernia: There is no hernia in the left inguinal area.  Genitourinary:    Penis: Normal.      Testes: Normal.  Musculoskeletal:        General: Normal range of motion.     Cervical back: Normal range of motion and neck supple.  Lymphadenopathy:     Cervical: No cervical adenopathy.  Skin:    General: Skin is warm and dry.  Neurological:     Mental Status: He is alert and oriented to person, place, and time.       Assessment & Plan:   Jiovanny was seen today for annual exam.  Diagnoses and all orders for this visit:  Complex regional pain syndrome type 2 of right upper extremity -     CBC with Differential/Platelet -     betamethasone acetate-betamethasone sodium phosphate (CELESTONE) injection 6 mg  Benign prostatic hyperplasia with nocturia -     CBC with Differential/Platelet  Well adult exam -     CBC with Differential/Platelet -     CMP14+EGFR -     Lipid panel -     Urinalysis -     VITAMIN D 25 Hydroxy (Vit-D Deficiency, Fractures) -     PSA, total and free  Vitamin D deficiency -     VITAMIN D 25 Hydroxy (Vit-D Deficiency, Fractures)  Screening for cholesterol level -     Lipid panel  Other orders -     HYDROcodone-acetaminophen (NORCO) 10-325 MG tablet; Take 1 tablet by mouth every 4 (four) hours as needed for up to 5 days for moderate  pain or severe pain.       I am having Steven Cummings "[name]" start on HYDROcodone-acetaminophen. I am also having him maintain his multivitamin with minerals, aspirin EC, Vitamin D (Ergocalciferol), EPINEPHrine, fluticasone, and Vitamin D3. We administered betamethasone acetate-betamethasone sodium phosphate.  Allergies as of 01/14/2023       Reactions   Sulfa Antibiotics Rash        Medication List        Accurate as of January 14, 2023  6:14 PM. If you have  any questions, ask your nurse or doctor.          aspirin EC 81 MG tablet Take 81 mg by mouth.   EPINEPHrine 0.3 mg/0.3 mL Soaj injection Commonly known as: EPI-PEN Inject 0.3 mg into the muscle as needed for anaphylaxis.   fluticasone 50 MCG/ACT nasal spray Commonly known as: FLONASE SPRAY 2 SPRAYS INTO EACH NOSTRIL EVERY DAY   HYDROcodone-acetaminophen 10-325 MG tablet Commonly known as: NORCO Take 1 tablet by mouth every 4 (four) hours as needed for up to 5 days for moderate pain or severe pain. Started by: Claretta Fraise, MD   multivitamin with minerals tablet Take by mouth.   Vitamin D (Ergocalciferol) 1.25 MG (50000 UNIT) Caps capsule Commonly known as: DRISDOL Take 50,000 Units by mouth every 7 (seven) days.   Vitamin D3 50 MCG (2000 UT) Tabs Generic drug: Cholecalciferol         Follow-up: Return in about 1 year (around 01/14/2024).  Claretta Fraise, M.D.

## 2023-01-15 LAB — CBC WITH DIFFERENTIAL/PLATELET
Basophils Absolute: 0 10*3/uL (ref 0.0–0.2)
Basos: 1 %
EOS (ABSOLUTE): 0.1 10*3/uL (ref 0.0–0.4)
Eos: 3 %
Hematocrit: 42 % (ref 37.5–51.0)
Hemoglobin: 14 g/dL (ref 13.0–17.7)
Immature Grans (Abs): 0 10*3/uL (ref 0.0–0.1)
Immature Granulocytes: 0 %
Lymphocytes Absolute: 1.2 10*3/uL (ref 0.7–3.1)
Lymphs: 30 %
MCH: 31.6 pg (ref 26.6–33.0)
MCHC: 33.3 g/dL (ref 31.5–35.7)
MCV: 95 fL (ref 79–97)
Monocytes Absolute: 0.6 10*3/uL (ref 0.1–0.9)
Monocytes: 14 %
Neutrophils Absolute: 2 10*3/uL (ref 1.4–7.0)
Neutrophils: 52 %
Platelets: 181 10*3/uL (ref 150–450)
RBC: 4.43 x10E6/uL (ref 4.14–5.80)
RDW: 11.9 % (ref 11.6–15.4)
WBC: 3.9 10*3/uL (ref 3.4–10.8)

## 2023-01-15 LAB — CMP14+EGFR
ALT: 17 IU/L (ref 0–44)
AST: 22 IU/L (ref 0–40)
Albumin/Globulin Ratio: 2.1 (ref 1.2–2.2)
Albumin: 4.8 g/dL (ref 3.9–4.9)
Alkaline Phosphatase: 98 IU/L (ref 44–121)
BUN/Creatinine Ratio: 18 (ref 10–24)
BUN: 16 mg/dL (ref 8–27)
Bilirubin Total: 1.3 mg/dL — ABNORMAL HIGH (ref 0.0–1.2)
CO2: 22 mmol/L (ref 20–29)
Calcium: 9.1 mg/dL (ref 8.6–10.2)
Chloride: 99 mmol/L (ref 96–106)
Creatinine, Ser: 0.88 mg/dL (ref 0.76–1.27)
Globulin, Total: 2.3 g/dL (ref 1.5–4.5)
Glucose: 91 mg/dL (ref 70–99)
Potassium: 4 mmol/L (ref 3.5–5.2)
Sodium: 137 mmol/L (ref 134–144)
Total Protein: 7.1 g/dL (ref 6.0–8.5)
eGFR: 94 mL/min/{1.73_m2} (ref >59)

## 2023-01-15 LAB — LIPID PANEL
Chol/HDL Ratio: 4 ratio (ref 0.0–5.0)
Cholesterol, Total: 174 mg/dL (ref 100–199)
HDL: 43 mg/dL (ref >39)
LDL Chol Calc (NIH): 112 mg/dL — ABNORMAL HIGH (ref 0–99)
Triglycerides: 103 mg/dL (ref 0–149)
VLDL Cholesterol Cal: 19 mg/dL (ref 5–40)

## 2023-01-15 LAB — VITAMIN D 25 HYDROXY (VIT D DEFICIENCY, FRACTURES): Vit D, 25-Hydroxy: 42.2 ng/mL (ref 30.0–100.0)

## 2023-01-15 LAB — PSA, TOTAL AND FREE
PSA, Free Pct: 24.3 %
PSA, Free: 0.17 ng/mL
Prostate Specific Ag, Serum: 0.7 ng/mL (ref 0.0–4.0)

## 2023-01-16 NOTE — Progress Notes (Signed)
Hello Darric,  Your lab result is normal and/or stable.Some minor variations that are not significant are commonly marked abnormal, but do not represent any medical problem for you.  Best regards, Claretta Fraise, M.D.

## 2023-02-16 ENCOUNTER — Encounter: Payer: Self-pay | Admitting: Family Medicine

## 2023-02-16 ENCOUNTER — Other Ambulatory Visit: Payer: Self-pay | Admitting: Family Medicine

## 2023-02-16 MED ORDER — PREDNISONE 10 MG PO TABS: ORAL_TABLET | ORAL | 0 refills | Status: DC

## 2023-03-04 ENCOUNTER — Encounter: Payer: Self-pay | Admitting: Family Medicine

## 2023-03-04 ENCOUNTER — Telehealth: Payer: Self-pay

## 2023-03-04 ENCOUNTER — Ambulatory Visit (INDEPENDENT_AMBULATORY_CARE_PROVIDER_SITE_OTHER): Payer: Medicare Other | Admitting: Family Medicine

## 2023-03-04 ENCOUNTER — Ambulatory Visit (INDEPENDENT_AMBULATORY_CARE_PROVIDER_SITE_OTHER): Payer: Medicare Other

## 2023-03-04 VITALS — BP 106/65 | HR 69 | Temp 98.1°F | Ht 71.0 in | Wt 180.4 lb

## 2023-03-04 DIAGNOSIS — M5416 Radiculopathy, lumbar region: Secondary | ICD-10-CM

## 2023-03-04 MED ORDER — PREGABALIN 50 MG PO CAPS: ORAL_CAPSULE | ORAL | 0 refills | Status: DC

## 2023-03-04 NOTE — Telephone Encounter (Signed)
Steven Cummings (Key: [id number]) PA Case ID #: [id number] Rx #: [id number] Need Help? Call us at [phone] Status sent iconSent to Plan today Drug Pregabalin  capsules ePA cloud logo Form [employer] Electronic PA Form [phone] NCPDP) Original Claim Info [phone] [place] REQUIRED-MD CONTACT [facility] PRIOR AUTHORIZATION(PHARMACY HELP DESK [phone])

## 2023-03-04 NOTE — Progress Notes (Signed)
Subjective:  Patient ID: Steven Cummings, male    DOB: 08-04-1956  Age: 67 y.o. MRN: [id number]  CC: Back Pain   HPI Steven Cummings presents for continued, worsening back pain. Catches, but constant. Onset 3 mos ago. No relief with stretching regimen given at last visit. Hard to get up after sitting for a while. Baseline pain is 3/10. With movement can flare to 10/10.Steroids have not helped. Doing back exercises also without relief.     03/04/2023    2:11 PM 01/14/2023    8:14 AM 12/09/2022    3:33 PM  Depression screen PHQ 2/9  Decreased Interest 0 0 0  Down, Depressed, Hopeless 0 0 0  PHQ - 2 Score 0 0 0    History Steven Cummings has a past medical history of Allergy and CRPS (complex regional pain syndrome), upper limb.   He has a past surgical history that includes Cardiac catheterization (2003); Tonsilectomy/adenoidectomy with myringotomy (1960); Knee surgery (Left, 1998); and Shoulder arthroscopy with rotator cuff repair (Right, 2017).   His family history includes Arthritis in his maternal grandfather, maternal grandmother, paternal grandfather, and paternal grandmother; Heart disease in his father and paternal grandfather; Hyperlipidemia in his father and mother; Hypertension in his father and mother; Kidney disease in his father; Stroke in his father.He reports that he quit smoking about 37 years ago. His smoking use included cigarettes. He has a 14.00 pack-year smoking history. He has never used smokeless tobacco. He reports current alcohol use of about 3.0 standard drinks of alcohol per week. He reports that he does not use drugs.    ROS Review of Systems  Constitutional:  Negative for fever.  Respiratory:  Negative for shortness of breath.   Cardiovascular:  Negative for chest pain.  Musculoskeletal:  Positive for back pain. Negative for arthralgias.  Skin:  Negative for rash.    Objective:  BP 106/65   Pulse 69   Temp 98.1 F (36.7 C)   Ht  (1.803 m)    Wt 180 lb 6.4 oz (81.8 kg)   SpO2 97%   BMI 25.16 kg/m   BP Readings from Last 3 Encounters:  03/04/23 106/65  01/14/23 (!) 99/57  01/12/22 98/60    Wt Readings from Last 3 Encounters:  03/04/23 180 lb 6.4 oz (81.8 kg)  01/14/23 180 lb 9.6 oz (81.9 kg)  12/09/22 175 lb (79.4 kg)     Physical Exam Vitals reviewed.  Constitutional:      General: He is in acute distress (back pain).     Appearance: He is well-developed.  HENT:     Head: Normocephalic and atraumatic.     Right Ear: External ear normal.     Left Ear: External ear normal.     Mouth/Throat:     Pharynx: No oropharyngeal exudate or posterior oropharyngeal erythema.  Eyes:     Pupils: Pupils are equal, round, and reactive to light.  Cardiovascular:     Rate and Rhythm: Normal rate and regular rhythm.     Heart sounds: No murmur heard. Pulmonary:     Effort: No respiratory distress.     Breath sounds: Normal breath sounds.  Musculoskeletal:        General: Tenderness (right superior SI and L5 to the right) present.     Cervical back: Normal range of motion and neck supple.  Neurological:     Mental Status: He is alert and oriented to person, place, and time.  Assessment & Plan:   Steven Cummings was seen today for back pain.  Diagnoses and all orders for this visit:  Lumbar radiculopathy, chronic -     MR LUMBAR SPINE WO CONTRAST; Future -     DG Lumbar Spine 2-3 Views; Future  Other orders -     pregabalin (LYRICA) 50 MG capsule; 1 qhs X7 days , then 2 qhs X 7d, then 3 qhs X 7d, then 4 qhs       I have discontinued Steven Cummings "[name]"'s predniSONE. I am also having him start on pregabalin. Additionally, I am having him maintain his multivitamin with minerals, aspirin EC, Vitamin D (Ergocalciferol), EPINEPHrine, fluticasone, and Vitamin D3.  Allergies as of 03/04/2023       Reactions   Sulfa Antibiotics Rash        Medication List        Accurate as of March 04, 2023  2:54 PM.  If you have any questions, ask your nurse or doctor.          STOP taking these medications    predniSONE 10 MG tablet Commonly known as: DELTASONE Stopped by: Mechele Claude, MD       TAKE these medications    aspirin EC 81 MG tablet Take 81 mg by mouth.   EPINEPHrine 0.3 mg/0.3 mL Soaj injection Commonly known as: EPI-PEN Inject 0.3 mg into the muscle as needed for anaphylaxis.   fluticasone 50 MCG/ACT nasal spray Commonly known as: FLONASE SPRAY 2 SPRAYS INTO EACH NOSTRIL EVERY DAY   multivitamin with minerals tablet Take by mouth.   pregabalin 50 MG capsule Commonly known as: Lyrica 1 qhs X7 days , then 2 qhs X 7d, then 3 qhs X 7d, then 4 qhs Started by: Mechele Claude, MD   Vitamin D (Ergocalciferol) 1.25 MG (50000 UNIT) Caps capsule Commonly known as: DRISDOL Take 50,000 Units by mouth every 7 (seven) days.   Vitamin D3 50 MCG (2000 UT) Tabs         Follow-up: Return in about 1 month (around 04/03/2023) for back pain.  Mechele Claude, M.D.

## 2023-03-08 ENCOUNTER — Encounter: Payer: Self-pay | Admitting: Family Medicine

## 2023-03-08 ENCOUNTER — Other Ambulatory Visit: Payer: Self-pay | Admitting: Family Medicine

## 2023-03-08 DIAGNOSIS — M461 Sacroiliitis, not elsewhere classified: Secondary | ICD-10-CM

## 2023-03-08 NOTE — Telephone Encounter (Signed)
CALLED PATIENT, NO ANSWER, LEFT MESSAGE TO RETURN CALL 

## 2023-03-08 NOTE — Telephone Encounter (Signed)
Why did we deny your request? We denied this request under Medicare Part D because: The information provided by your prescriber did not  meet the requirements for covering this medication (prior authorization).  Your plan does not allow coverage of this medication based on your prescriber answering No to the following  question(s): Is the requested drug being prescribed for any of the following: A) Management of postherpetic neuralgia, B)  Management of neuropathic pain associated with diabetic peripheral neuropathy, C) Cancer-related  neuropathic pain, D) Cancer treatment-related neuropathic pain? You should share a copy of this decision with your prescriber so you and your prescriber can discuss next steps.  If your prescriber requested coverage on your behalf, we have shared this decision with your prescriber.

## 2023-03-08 NOTE — Telephone Encounter (Signed)
Let pt. Know that the medication is not covered for his situation. Only for diaetes, cancer and shingles.

## 2023-03-09 NOTE — Telephone Encounter (Signed)
PATIENT IS AWARE, VIEWED ON MYCHART

## 2023-03-29 ENCOUNTER — Ambulatory Visit (HOSPITAL_COMMUNITY): Payer: Medicare Other

## 2023-04-01 ENCOUNTER — Ambulatory Visit: Payer: Medicare Other | Admitting: Family Medicine

## 2023-04-02 DIAGNOSIS — M545 Low back pain, unspecified: Secondary | ICD-10-CM | POA: Insufficient documentation

## 2024-01-18 ENCOUNTER — Encounter: Payer: Self-pay | Admitting: Family Medicine

## 2024-01-18 ENCOUNTER — Ambulatory Visit (INDEPENDENT_AMBULATORY_CARE_PROVIDER_SITE_OTHER): Payer: Medicare Other | Admitting: Family Medicine

## 2024-01-18 VITALS — BP 122/73 | HR 59 | Temp 97.6°F | Ht 71.0 in | Wt 181.0 lb

## 2024-01-18 DIAGNOSIS — Z13220 Encounter for screening for lipoid disorders: Secondary | ICD-10-CM

## 2024-01-18 DIAGNOSIS — N401 Enlarged prostate with lower urinary tract symptoms: Secondary | ICD-10-CM | POA: Diagnosis not present

## 2024-01-18 DIAGNOSIS — G90511 Complex regional pain syndrome I of right upper limb: Secondary | ICD-10-CM | POA: Diagnosis not present

## 2024-01-18 DIAGNOSIS — Z0000 Encounter for general adult medical examination without abnormal findings: Secondary | ICD-10-CM

## 2024-01-18 DIAGNOSIS — R351 Nocturia: Secondary | ICD-10-CM | POA: Diagnosis not present

## 2024-01-18 DIAGNOSIS — E559 Vitamin D deficiency, unspecified: Secondary | ICD-10-CM | POA: Diagnosis not present

## 2024-01-18 LAB — URINALYSIS
Bilirubin, UA: NEGATIVE
Glucose, UA: NEGATIVE
Ketones, UA: NEGATIVE
Leukocytes,UA: NEGATIVE
Nitrite, UA: NEGATIVE
Protein,UA: NEGATIVE
RBC, UA: NEGATIVE
Specific Gravity, UA: 1.015 (ref 1.005–1.030)
Urobilinogen, Ur: 0.2 mg/dL (ref 0.2–1.0)
pH, UA: 7 (ref 5.0–7.5)

## 2024-01-18 MED ORDER — HYDROCODONE-ACETAMINOPHEN 10-325 MG PO TABS: 1.0000 | ORAL_TABLET | ORAL | 0 refills | Status: AC | PRN

## 2024-01-18 NOTE — Progress Notes (Addendum)
 Subjective:  Patient ID: Steven Cummings, male    DOB: 1956-01-26  Age: 68 y.o. MRN: [id number]  CC: Annual Exam   HPI [name] presents for Annual exam.  CRPS right shoulder - not much trouble recently. Using hydrocodone about every 2 months for a few days. Still has some left in the bottle.      01/18/2024    7:56 AM 03/04/2023    2:11 PM 01/14/2023    8:14 AM  Depression screen PHQ 2/9  Decreased Interest 0 0 0  Down, Depressed, Hopeless 0 0 0  PHQ - 2 Score 0 0 0  Altered sleeping 0    Tired, decreased energy 0    Change in appetite 0    Feeling bad or failure about yourself  0    Trouble concentrating 0    Moving slowly or fidgety/restless 0    Suicidal thoughts 0    PHQ-9 Score 0    Difficult doing work/chores Not difficult at all      History Kendric has a past medical history of Allergy and CRPS (complex regional pain syndrome), upper limb.   He has a past surgical history that includes Cardiac catheterization (2003); Tonsilectomy/adenoidectomy with myringotomy (1960); Knee surgery (Left, 1998); and Shoulder arthroscopy with rotator cuff repair (Right, 2017).   His family history includes Arthritis in his maternal grandfather, maternal grandmother, paternal grandfather, and paternal grandmother; Heart disease in his father and paternal grandfather; Hyperlipidemia in his father and mother; Hypertension in his father and mother; Kidney disease in his father; Stroke in his father.He reports that he quit smoking about 38 years ago. His smoking use included cigarettes. He started smoking about 52 years ago. He has a 14 pack-year smoking history. He has never used smokeless tobacco. He reports current alcohol use of about 3.0 standard drinks of alcohol per week. He reports that he does not use drugs.    ROS Review of Systems  Constitutional:  Negative for fever.  Respiratory:  Negative for shortness of breath.   Cardiovascular:  Negative for chest pain.   Genitourinary:  Positive for frequency (nocturia X 2). Negative for flank pain.  Musculoskeletal:  Negative for arthralgias.  Skin:  Negative for rash.    Objective:  BP 122/73   Pulse (!) 59   Temp 97.6 F (36.4 C)   Ht 5\' 11"  (1.803 m)   Wt 181 lb (82.1 kg)   SpO2 99%   BMI 25.24 kg/m   BP Readings from Last 3 Encounters:  01/18/24 122/73  03/04/23 106/65  01/14/23 (!) 99/57    Wt Readings from Last 3 Encounters:  01/18/24 181 lb (82.1 kg)  03/04/23 180 lb 6.4 oz (81.8 kg)  01/14/23 180 lb 9.6 oz (81.9 kg)     Physical Exam Constitutional:      Appearance: He is well-developed.  HENT:     Head: Normocephalic and atraumatic.  Eyes:     Pupils: Pupils are equal, round, and reactive to light.  Neck:     Thyroid: No thyromegaly.     Trachea: No tracheal deviation.  Cardiovascular:     Rate and Rhythm: Normal rate and regular rhythm.     Heart sounds: Normal heart sounds. No murmur heard.    No friction rub. No gallop.  Pulmonary:     Breath sounds: Normal breath sounds. No wheezing or rales.  Abdominal:     General: Bowel sounds are normal. There is no distension.  Palpations: Abdomen is soft. There is no mass.     Tenderness: There is no abdominal tenderness.     Hernia: There is no hernia in the left inguinal area.  Genitourinary:    Penis: Normal.      Testes: Normal.  Musculoskeletal:        General: Normal range of motion.     Cervical back: Normal range of motion.  Lymphadenopathy:     Cervical: No cervical adenopathy.  Skin:    General: Skin is warm and dry.  Neurological:     Mental Status: He is alert and oriented to person, place, and time.       Assessment & Plan:   [name] was seen today for annual exam.  Diagnoses and all orders for this visit:  Benign prostatic hyperplasia with nocturia -     CBC with Differential/Platelet -     CMP14+EGFR -     PSA, total and free -     Urinalysis  Complex regional pain syndrome  type 1 of right upper extremity -     CMP14+EGFR -     HYDROcodone-acetaminophen (NORCO) 10-325 MG tablet; Take 1 tablet by mouth every 4 (four) hours as needed for up to 5 days for moderate pain (pain score 4-6) or severe pain (pain score 7-10).  Vitamin D deficiency -     CBC with Differential/Platelet -     VITAMIN D 25 Hydroxy (Vit-D Deficiency, Fractures)  Screening for cholesterol level -     CBC with Differential/Platelet -     Lipid panel       I have discontinued Steven Cummings "[name]"'s pregabalin. I am also having him maintain his multivitamin with minerals, aspirin EC, Vitamin D (Ergocalciferol), EPINEPHrine, fluticasone, and Vitamin D3.  Allergies as of 01/18/2024       Reactions   Sulfa Antibiotics Rash        Medication List        Accurate as of January 18, 2024 11:59 PM. If you have any questions, ask your nurse or doctor.          STOP taking these medications    pregabalin 50 MG capsule Commonly known as: Lyrica Stopped by: Nance Mccombs       TAKE these medications    aspirin EC 81 MG tablet Take 81 mg by mouth.   EPINEPHrine 0.3 mg/0.3 mL Soaj injection Commonly known as: EPI-PEN Inject 0.3 mg into the muscle as needed for anaphylaxis.   fluticasone 50 MCG/ACT nasal spray Commonly known as: FLONASE SPRAY 2 SPRAYS INTO EACH NOSTRIL EVERY DAY   HYDROcodone-acetaminophen 10-325 MG tablet Commonly known as: NORCO Take 1 tablet by mouth every 4 (four) hours as needed for up to 5 days for moderate pain (pain score 4-6) or severe pain (pain score 7-10).   multivitamin with minerals tablet Take by mouth.   Vitamin D (Ergocalciferol) 1.25 MG (50000 UNIT) Caps capsule Commonly known as: DRISDOL Take 50,000 Units by mouth every 7 (seven) days.   Vitamin D3 50 MCG (2000 UT) Tabs       Excellent health, good health habits. Pt. Encouraged in these  Follow-up: Return in about 1 year (around 01/17/2025) for [facility] physical.  Mechele Claude, M.D.

## 2024-01-19 ENCOUNTER — Encounter: Payer: Self-pay | Admitting: Family Medicine

## 2024-01-19 LAB — CBC WITH DIFFERENTIAL/PLATELET
Basophils Absolute: 0 10*3/uL (ref 0.0–0.2)
Basos: 1 %
EOS (ABSOLUTE): 0.2 10*3/uL (ref 0.0–0.4)
Eos: 4 %
Hematocrit: 44 % (ref 37.5–51.0)
Hemoglobin: 14.8 g/dL (ref 13.0–17.7)
Immature Grans (Abs): 0 10*3/uL (ref 0.0–0.1)
Immature Granulocytes: 0 %
Lymphocytes Absolute: 1.6 10*3/uL (ref 0.7–3.1)
Lymphs: 37 %
MCH: 31.6 pg (ref 26.6–33.0)
MCHC: 33.6 g/dL (ref 31.5–35.7)
MCV: 94 fL (ref 79–97)
Monocytes Absolute: 0.5 10*3/uL (ref 0.1–0.9)
Monocytes: 13 %
Neutrophils Absolute: 2 10*3/uL (ref 1.4–7.0)
Neutrophils: 45 %
Platelets: 183 10*3/uL (ref 150–450)
RBC: 4.68 x10E6/uL (ref 4.14–5.80)
RDW: 12.4 % (ref 11.6–15.4)
WBC: 4.3 10*3/uL (ref 3.4–10.8)

## 2024-01-19 LAB — CMP14+EGFR
ALT: 21 IU/L (ref 0–44)
AST: 20 IU/L (ref 0–40)
Albumin: 4.9 g/dL (ref 3.9–4.9)
Alkaline Phosphatase: 112 IU/L (ref 44–121)
BUN/Creatinine Ratio: 20 (ref 10–24)
BUN: 17 mg/dL (ref 8–27)
Bilirubin Total: 1.1 mg/dL (ref 0.0–1.2)
CO2: 22 mmol/L (ref 20–29)
Calcium: 9.4 mg/dL (ref 8.6–10.2)
Chloride: 99 mmol/L (ref 96–106)
Creatinine, Ser: 0.86 mg/dL (ref 0.76–1.27)
Globulin, Total: 2.6 g/dL (ref 1.5–4.5)
Glucose: 90 mg/dL (ref 70–99)
Potassium: 4.1 mmol/L (ref 3.5–5.2)
Sodium: 138 mmol/L (ref 134–144)
Total Protein: 7.5 g/dL (ref 6.0–8.5)
eGFR: 94 mL/min/{1.73_m2} (ref >59)

## 2024-01-19 LAB — PSA, TOTAL AND FREE
PSA, Free Pct: 35 %
PSA, Free: 0.21 ng/mL
Prostate Specific Ag, Serum: 0.6 ng/mL (ref 0.0–4.0)

## 2024-01-19 LAB — LIPID PANEL
Chol/HDL Ratio: 4.6 ratio (ref 0.0–5.0)
Cholesterol, Total: 192 mg/dL (ref 100–199)
HDL: 42 mg/dL (ref >39)
LDL Chol Calc (NIH): 128 mg/dL — ABNORMAL HIGH (ref 0–99)
Triglycerides: 121 mg/dL (ref 0–149)
VLDL Cholesterol Cal: 22 mg/dL (ref 5–40)

## 2024-01-19 LAB — VITAMIN D 25 HYDROXY (VIT D DEFICIENCY, FRACTURES): Vit D, 25-Hydroxy: 56 ng/mL (ref 30.0–100.0)

## 2024-01-29 NOTE — Addendum Note (Signed)
 Addended by: Mechele Claude on: 01/29/2024 10:27 PM   Modules accepted: Level of Service

## 2024-04-12 ENCOUNTER — Encounter: Payer: Self-pay | Admitting: Family Medicine

## 2024-04-12 DIAGNOSIS — R9389 Abnormal findings on diagnostic imaging of other specified body structures: Secondary | ICD-10-CM

## 2024-04-12 DIAGNOSIS — Z8249 Family history of ischemic heart disease and other diseases of the circulatory system: Secondary | ICD-10-CM

## 2024-04-17 ENCOUNTER — Telehealth: Payer: Self-pay | Admitting: Family Medicine

## 2024-04-17 NOTE — Telephone Encounter (Signed)
 Copied from CRM [id number]. Topic: Appointments - Scheduling Inquiry for Clinic >> Apr 17, 2024  1:44 PM Star East wrote: Reason for CRM: US  AORTA (Order [id number]) patient inquiring on scheduling also question about echocardiogram, please call [phone]

## 2024-04-17 NOTE — Telephone Encounter (Signed)
 Called and discussed with patient. LS

## 2024-04-25 ENCOUNTER — Ambulatory Visit

## 2024-04-25 VITALS — BP 122/73 | HR 59 | Ht 71.0 in | Wt 181.0 lb

## 2024-04-25 DIAGNOSIS — Z0000 Encounter for general adult medical examination without abnormal findings: Secondary | ICD-10-CM | POA: Diagnosis not present

## 2024-04-25 NOTE — Patient Instructions (Signed)
 Steven Cummings , Thank you for taking time out of your busy schedule to complete your Annual Wellness Visit with me. I enjoyed our conversation and look forward to speaking with you again next year. I, as well as your care team,  appreciate your ongoing commitment to your health goals. Please review the following plan we discussed and let me know if I can assist you in the future. Your Game plan/ To Do List   Follow up Visits: Next Medicare AWV with our clinical staff: 04/26/25 at 1:10a.m.    Next Office Visit with your provider: 01/18/25 at 7:55a.m.  Clinician Recommendations:  Aim for 30 minutes of exercise or brisk walking, 6-8 glasses of water, and 5 servings of fruits and vegetables each day. N/a      This is a list of the screening recommended for you and due dates:  Health Maintenance  Topic Date Due   Zoster (Shingles) Vaccine (1 of 2) Never done   Pneumonia Vaccine (1 of 1 - PCV) 01/17/2025*   COVID-19 Vaccine (6 - 2024-25 season) 05/11/2025*   Flu Shot  06/16/2024   Medicare Annual Wellness Visit  04/25/2025   DTaP/Tdap/Td vaccine (3 - Td or Tdap) 09/08/2026   Colon Cancer Screening  12/10/2026   Hepatitis C Screening  Completed   HPV Vaccine  Aged Out   Meningitis B Vaccine  Aged Out  *Topic was postponed. The date shown is not the original due date.    Advanced directives: (Declined) Advance directive discussed with you today. Even though you declined this today, please call our office should you change your mind, and we can give you the proper paperwork for you to fill out. Advance Care Planning is important because it:  [x]  Makes sure you receive the medical care that is consistent with your values, goals, and preferences  [x]  It provides guidance to your family and loved ones and reduces their decisional burden about whether or not they are making the right decisions based on your wishes.  Follow the link provided in your after visit summary or read over the paperwork we  have mailed to you to help you started getting your Advance Directives in place. If you need assistance in completing these, please reach out to us  so that we can help you!  See attachments for Preventive Care and Fall Prevention Tips.

## 2024-04-25 NOTE — Progress Notes (Signed)
 Subjective:   Steven Cummings is a 68 y.o. who presents for a Medicare Wellness preventive visit.  As a reminder, Annual Wellness Visits don't include a physical exam, and some assessments may be limited, especially if this visit is performed virtually. We may recommend an in-person follow-up visit with your provider if needed.  Visit Complete: Virtual I connected with  Steven Cummings on 04/25/24 by a audio enabled telemedicine application and verified that I am speaking with the correct person using two identifiers.  Patient Location: Home  Provider Location: Home Office  I discussed the limitations of evaluation and management by telemedicine. The patient expressed understanding and agreed to proceed.  Vital Signs: Because this visit was a virtual/telehealth visit, some criteria may be missing or patient reported. Any vitals not documented were not able to be obtained and vitals that have been documented are patient reported.  VideoDeclined- This patient declined Librarian, academic. Therefore the visit was completed with audio only.  Persons Participating in Visit: Patient.  AWV Questionnaire: No: Patient Medicare AWV questionnaire was not completed prior to this visit.  Cardiac Risk Factors include: advanced age (>32men, >42 women);male gender     Objective:     Today's Vitals   04/25/24 1613  BP: 122/73  Pulse: (!) 59  Weight: 181 lb (82.1 kg)  Height: 5\' 11"  (1.803 m)   Body mass index is 25.24 kg/m.     04/25/2024    2:57 PM 12/09/2022    3:28 PM 12/03/2021    4:12 PM  Advanced Directives  Does Patient Have a Medical Advance Directive? Yes Yes Yes  Type of Estate agent of [place];Living will Healthcare Power of [place];Living will Healthcare Power of [place];Living will  Does patient want to make changes to medical advance directive?  No - Patient declined   Copy of Healthcare Power of Attorney in  Chart?  No - copy requested No - copy requested    Current Medications (verified) Outpatient Encounter Medications as of 04/25/2024  Medication Sig   aspirin EC 81 MG tablet Take 81 mg by mouth.   Cholecalciferol (VITAMIN D3) 50 MCG (2000 UT) TABS    EPINEPHrine  0.3 mg/0.3 mL IJ SOAJ injection Inject 0.3 mg into the muscle as needed for anaphylaxis.   fluticasone  (FLONASE ) 50 MCG/ACT nasal spray SPRAY 2 SPRAYS INTO EACH NOSTRIL EVERY DAY   Multiple Vitamins-Minerals (MULTIVITAMIN WITH MINERALS) tablet Take by mouth.   Vitamin D , Ergocalciferol , (DRISDOL ) 1.25 MG (50000 UNIT) CAPS capsule Take 50,000 Units by mouth every 7 (seven) days.   No facility-administered encounter medications on file as of 04/25/2024.    Allergies (verified) Sulfa antibiotics   History: Past Medical History:  Diagnosis Date   Allergy 2000   Grass & weeds   CRPS (complex regional pain syndrome), upper limb    Past Surgical History:  Procedure Laterality Date   CARDIAC CATHETERIZATION  2003   KNEE SURGERY Left 1998   SHOULDER ARTHROSCOPY WITH ROTATOR CUFF REPAIR Right 2017   TONSILECTOMY/ADENOIDECTOMY WITH MYRINGOTOMY  1960   Family History  Problem Relation Age of Onset   Hyperlipidemia Mother    Hypertension Mother    Heart disease Father    Hyperlipidemia Father    Hypertension Father    Kidney disease Father    Stroke Father    Arthritis Maternal Grandmother    Arthritis Maternal Grandfather    Arthritis Paternal Grandmother    Arthritis Paternal Grandfather    Heart  disease Paternal Grandfather    Social History   Socioeconomic History   Marital status: Married    Spouse name: Not on file   Number of children: 2   Years of education: Not on file   Highest education level: Not on file  Occupational History   Occupation: retired since age 34    Comment: business owner, 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, etc  Tobacco Use   Smoking status: Former    Current packs/day: 0.00    Average packs/day: 1  pack/day for 14.0 years (14.0 ttl pk-yrs)    Types: Cigarettes    Start date: 12/15/1971    Quit date: 12/14/1985    Years since quitting: 38.3   Smokeless tobacco: Never  Vaping Use   Vaping status: Never Used  Substance and Sexual Activity   Alcohol use: Yes    Alcohol/week: 3.0 standard drinks of alcohol    Types: 3 Cans of beer per week   Drug use: No   Sexual activity: Yes  Other Topics Concern   Not on file  Social History Narrative   1 son in [place], 1 daughter 2 miles away, 3 grandsons - they all spend lots of time together   Father living in [place] with dementia   2 story home with wife - very active 15,000-20,000 steps per day   Social Drivers of Health   Financial Resource Strain: Low Risk  (04/25/2024)   Overall Financial Resource Strain (CARDIA)    Difficulty of Paying Living Expenses: Not hard at all  Food Insecurity: No Food Insecurity (04/25/2024)   Hunger Vital Sign    Worried About Running Out of Food in the Last Year: Never true    Ran Out of Food in the Last Year: Never true  Transportation Needs: No Transportation Needs (04/25/2024)   PRAPARE - Administrator, Civil Service (Medical): No    Lack of Transportation (Non-Medical): No  Physical Activity: Sufficiently Active (04/25/2024)   Exercise Vital Sign    Days of Exercise per Week: 7 days    Minutes of Exercise per Session: 30 min  Stress: No Stress Concern Present (04/25/2024)   [employer] of Occupational Health - Occupational Stress Questionnaire    Feeling of Stress : Not at all  Social Connections: Moderately Isolated (04/25/2024)   Social Connection and Isolation Panel [NHANES]    Frequency of Communication with Friends and Family: More than three times a week    Frequency of Social Gatherings with Friends and Family: More than three times a week    Attends Religious Services: Never    Database administrator or Organizations: No    Attends Engineer, structural:  Never    Marital Status: Married    Tobacco Counseling Counseling given: Yes    Clinical Intake:  Pre-visit preparation completed: Yes  Pain : No/denies pain     Nutritional Risks: None Diabetes: No  No results found for: "HGBA1C"   How often do you need to have someone help you when you read instructions, pamphlets, or other written materials from your doctor or pharmacy?: 1 - Never  Interpreter Needed?: No  Information entered by :: Alia t/cma   Activities of Daily Living     04/25/2024    2:54 PM  In your present state of health, do you have any difficulty performing the following activities:  Hearing? 1  Comment some  Vision? 0  Difficulty concentrating or making decisions? 0  Walking or climbing stairs?  0  Dressing or bathing? 0  Doing errands, shopping? 0  Preparing Food and eating ? N  Using the Toilet? N  In the past six months, have you accidently leaked urine? N  Do you have problems with loss of bowel control? N  Managing your Medications? N  Managing your Finances? N  Housekeeping or managing your Housekeeping? N    Patient Care Team: Roise Cleaver, MD as PCP - General (Family Medicine) Dr Pat[facility], OD (Optometry)  I have updated your Care Teams any recent Medical Services you may have received from other providers in the past year.     Assessment:    This is a routine wellness examination for Steven Cummings.  Hearing/Vision screen Hearing Screening - Comments:: Pt stated some Vision Screening - Comments:: pt wear glasses denies/pt goes to [facility], Dr. Regina Capes appt 3mos ago   Goals Addressed             This Visit's Progress    Patient Stated   On track    Stay healthy and active       Depression Screen     04/25/2024    3:01 PM 01/18/2024    7:56 AM 03/04/2023    2:11 PM 01/14/2023    8:14 AM 12/09/2022    3:33 PM 01/12/2022    9:03 AM 12/03/2021    4:13 PM  PHQ 2/9 Scores  PHQ - 2 Score 0 0 0 0  0 0 0  PHQ- 9 Score 0 0    0     Fall Risk     04/25/2024    2:58 PM 03/04/2023    2:11 PM 01/14/2023    8:14 AM 12/09/2022    3:32 PM 01/12/2022    9:03 AM  Fall Risk   Falls in the past year? 0 0 0 0 0  Number falls in past yr: 0   0 0  Injury with Fall? 0   0 0  Risk for fall due to : No Fall Risks    No Fall Risks  Follow up Falls evaluation completed   Falls prevention discussed;Education provided;Falls evaluation completed     MEDICARE RISK AT HOME:  Medicare Risk at Home Any stairs in or around the home?: Yes If so, are there any without handrails?: No Home free of loose throw rugs in walkways, pet beds, electrical cords, etc?: Yes Adequate lighting in your home to reduce risk of falls?: Yes Life alert?: No Use of a cane, walker or w/c?: No Grab bars in the bathroom?: No Shower chair or bench in shower?: No Elevated toilet seat or a handicapped toilet?: Yes  TIMED UP AND GO:  Was the test performed?  no  Cognitive Function: 6CIT completed        04/25/2024    3:02 PM 12/09/2022    3:29 PM 12/03/2021    4:15 PM  6CIT Screen  What Year? 0 points 0 points 0 points  What month? 0 points 0 points 0 points  What time? 0 points 0 points 0 points  Count back from 20 0 points 0 points 0 points  Months in reverse 0 points 0 points 0 points  Repeat phrase 2 points 0 points 0 points  Total Score 2 points 0 points 0 points    Immunizations Immunization History  Administered Date(s) Administered   Fluad Quad(high Dose 65+) 08/21/2020, 08/16/2022   Influenza, High Dose Seasonal PF 08/22/2023   Influenza,inj,Quad PF,6+ Mos  08/15/2018, 08/10/2019   Influenza-Unspecified 08/24/2017   Moderna Covid-19 Vaccine Bivalent Booster 36yrs & up 08/22/2021   Moderna Sars-Covid-2 Vaccination 01/11/2020, 02/09/2020, 09/17/2020   Pfizer(Comirnaty)Fall Seasonal Vaccine 12 years and older 11/03/2022   Td 09/08/2016   Tdap 09/08/2016    Screening Tests Health Maintenance  Topic Date  Due   Zoster Vaccines- Shingrix (1 of 2) Never done   Pneumonia Vaccine 62+ Years old (1 of 1 - PCV) 01/17/2025 (Originally 01/16/2005)   COVID-19 Vaccine (6 - 2024-25 season) 05/11/2025 (Originally 07/18/2023)   INFLUENZA VACCINE  06/16/2024   Medicare Annual Wellness (AWV)  04/25/2025   DTaP/Tdap/Td (3 - Td or Tdap) 09/08/2026   Colonoscopy  12/10/2026   Hepatitis C Screening  Completed   HPV VACCINES  Aged Out   Meningococcal B Vaccine  Aged Out    Health Maintenance  Health Maintenance Due  Topic Date Due   Zoster Vaccines- Shingrix (1 of 2) Never done   Health Maintenance Items Addressed: See Nurse Notes at the end of this note  Additional Screening:  Vision Screening: Recommended annual ophthalmology exams for early detection of glaucoma and other disorders of the eye. Would you like a referral to an eye doctor? No    Dental Screening: Recommended annual dental exams for proper oral hygiene  Community Resource Referral / Chronic Care Management: CRR required this visit?  No   CCM required this visit?  No   Plan:    I have personally reviewed and noted the following in the patient's chart:   Medical and social history Use of alcohol, tobacco or illicit drugs  Current medications and supplements including opioid prescriptions. Patient is not currently taking opioid prescriptions. Functional ability and status Nutritional status Physical activity Advanced directives List of other physicians Hospitalizations, surgeries, and ER visits in previous 12 months Vitals Screenings to include cognitive, depression, and falls Referrals and appointments  In addition, I have reviewed and discussed with patient certain preventive protocols, quality metrics, and best practice recommendations. A written personalized care plan for preventive services as well as general preventive health recommendations were provided to patient.   Steven Cummings, CMA   04/25/2024   After Visit  Summary: (MyChart) Due to this being a telephonic visit, the after visit summary with patients personalized plan was offered to patient via MyChart   Notes: pt is aware and encourage to get the shingles vaccine at his next office visit.

## 2024-05-03 ENCOUNTER — Ambulatory Visit (HOSPITAL_COMMUNITY)
Admission: RE | Admit: 2024-05-03 | Discharge: 2024-05-03 | Disposition: A | Discharge status: Home / Self Care | Source: Ambulatory Visit | Attending: Family Medicine | Admitting: Family Medicine

## 2024-05-03 DIAGNOSIS — Z8249 Family history of ischemic heart disease and other diseases of the circulatory system: Secondary | ICD-10-CM | POA: Insufficient documentation

## 2024-05-03 DIAGNOSIS — R9389 Abnormal findings on diagnostic imaging of other specified body structures: Secondary | ICD-10-CM | POA: Insufficient documentation

## 2024-05-04 ENCOUNTER — Ambulatory Visit: Payer: Self-pay | Admitting: Family Medicine

## 2024-05-10 ENCOUNTER — Ambulatory Visit (HOSPITAL_COMMUNITY)

## 2024-05-16 ENCOUNTER — Other Ambulatory Visit: Payer: Self-pay | Admitting: Family Medicine

## 2024-05-16 DIAGNOSIS — Z8249 Family history of ischemic heart disease and other diseases of the circulatory system: Secondary | ICD-10-CM

## 2024-05-25 ENCOUNTER — Telehealth: Payer: Self-pay

## 2024-05-25 NOTE — Telephone Encounter (Signed)
 Copied from CRM [id number]. Topic: Referral - Status >> May 25, 2024  3:56 PM Sasha H wrote: Reason for CRM: Pt is calling wanting to know the status of his cardiology referral as he is starting to have slight symptoms now.

## 2024-05-26 NOTE — Telephone Encounter (Signed)
 I have marked Referral Urgent and  Referral sent to: [facility] at [facility] [address] 5th Floor - [place] [address] [phone]   The above Office has more Providers and more available Appt's.  I also sent Patient a Wellsite geologist with Specialty Office contact information.

## 2024-05-27 NOTE — Progress Notes (Unsigned)
 Cardiology Clinic Note   Date: 05/29/2024 ID: Steven Cummings, DOB 1956/09/20, MRN [id number]  Primary Care Provider: Zollie Lowers, MD   Chief Complaint   Steven Cummings is a 68 y.o. male who presents to the clinic today for new patient visit.   Patient Profile      Past medical history significant for: Nonobstructive CAD.  Complex regional pain syndrome right upper extremity. Former tobacco use.      History of Present Illness    Today, patient is here to establish care. He reports his brother was recently diagnosed with a bicuspid valve and will need to undergo valve replacement. It was strongly suggested that he also get evaluated. He endorses a family history of valve disease in his father who had a valve replacement and multiple uncles and cousins who died from an MI before the age of 72. He has a history of tobacco abuse having quit in his 30s.  He states he had a light heart attack in 2003. He had a heart catheterization that showed 40% stenosis in one of the arteries but no stenosis requiring PCI. After his heart attack he improved his diet, lost weight and started exercises. He has been in good overall health since then. He denies lower extremity edema, orthopnea or PND. No palpitations. He walks 15,000-20,000 steps most days. He also does some body weight exercises when he can. He gets occasional positional lightheadedness particularly when he has his head dropped down working in his garden then stands up quickly. He reports some chest pressure in the mornings and rarely with activity. He also gets mild dyspnea with heavier exertion. He finds the longer he does an activity the pressure or dyspnea will improve.    ROS: All other systems reviewed and are otherwise negative except as noted in History of Present Illness.  EKGs/Labs Reviewed    EKG Interpretation Date/Time:  Monday May 29 2024 07:59:53 EDT Ventricular Rate:  59 PR Interval:  186 QRS  Duration:  104 QT Interval:  392 QTC Calculation: 388 R Axis:   -68  Text Interpretation: Sinus bradycardia Left axis deviation Low voltage QRS Incomplete right bundle branch block Confirmed by Loistine Sober [phone]) on 05/29/2024 8:04:35 AM   01/18/2024: ALT 21; AST 20; BUN 17; Creatinine, Ser 0.86; Potassium 4.1; Sodium 138   01/18/2024: Hemoglobin 14.8; WBC 4.3    Physical Exam    VS:  BP 130/70   Pulse (!) 59   Ht 5' 11 (1.803 m)   Wt 178 lb 12.8 oz (81.1 kg)   SpO2 98%   BMI 24.94 kg/m  , BMI Body mass index is 24.94 kg/m.  GEN: Well nourished, well developed, in no acute distress. Neck: No JVD or carotid bruits. Cardiac:  RRR.  No murmur. No rubs or gallops.   Respiratory:  Respirations regular and unlabored. Clear to auscultation without rales, wheezing or rhonchi. GI: Soft, nontender, nondistended. Extremities: Radials/DP/PT 2+ and equal bilaterally. No clubbing or cyanosis. No edema   Skin: Warm and dry, no rash. Neuro: Strength intact.  Assessment & Plan   Nonobstructive CAD/DOE/Chest pressure Patient reports LHC in 2003 showing 40% stenosis in one of his arteries (no report available). He has a strong family history of premature CAD with several uncles and cousins passing away from MI before age 54. His father and brother have valve disease and his brother is currently pending valve replacement. He endorses occasional chest pressure not usually associated with exertion as well as mild  dyspnea with heavier exertion. He is active walking for exercise and doing body weight exercises when he can. EKG is without acute findings. No murmur auscultated on exam today. Given occasional chest pressure and dyspnea, as well as his family history, recommend further evaluation with coronary CTA and echo. Patient is in agreement.  - Schedule coronary CTA and echo. - BMP today.    Disposition: BMP today. Schedule coronary CTA and echo. Return in 6-8 weeks after testing or sooner as  needed.          Signed, Barnie HERO. Loran Fleet, DNP, NP-C

## 2024-05-29 ENCOUNTER — Encounter: Payer: Self-pay | Admitting: Student

## 2024-05-29 ENCOUNTER — Ambulatory Visit: Attending: Student | Admitting: Student

## 2024-05-29 VITALS — BP 130/70 | HR 59 | Ht 71.0 in | Wt 178.8 lb

## 2024-05-29 DIAGNOSIS — R0789 Other chest pain: Secondary | ICD-10-CM | POA: Insufficient documentation

## 2024-05-29 DIAGNOSIS — Z01812 Encounter for preprocedural laboratory examination: Secondary | ICD-10-CM | POA: Diagnosis not present

## 2024-05-29 DIAGNOSIS — I2510 Atherosclerotic heart disease of native coronary artery without angina pectoris: Secondary | ICD-10-CM | POA: Diagnosis not present

## 2024-05-29 DIAGNOSIS — R0609 Other forms of dyspnea: Secondary | ICD-10-CM | POA: Diagnosis present

## 2024-05-29 DIAGNOSIS — R072 Precordial pain: Secondary | ICD-10-CM | POA: Insufficient documentation

## 2024-05-29 MED ORDER — METOPROLOL TARTRATE 25 MG PO TABS: ORAL_TABLET | ORAL | 0 refills | Status: DC

## 2024-05-29 NOTE — Telephone Encounter (Signed)
 Seen by cardiology today. LS

## 2024-05-29 NOTE — Patient Instructions (Signed)
 Medication Instructions:  Your physician recommends that you continue on your current medications as directed. Please refer to the Current Medication list given to you today.    Take 25 mg Metoprolol  once 2 hours prior to your CT Angio Procedure.  *If you need a refill on your cardiac medications before your next appointment, please call your pharmacy*  Lab Work: Your provider would like for you to have following labs drawn today [facility].   If you have labs (blood work) drawn today and your tests are completely normal, you will receive your results only by: MyChart Message (if you have MyChart) OR A paper copy in the mail If you have any lab test that is abnormal or we need to change your treatment, we will call you to review the results.  Testing/Procedures: Your physician has requested that you have an echocardiogram. Echocardiography is a painless test that uses sound waves to create images of your heart. It provides your doctor with information about the size and shape of your heart and how well your heart's chambers and valves are working.   You may receive an ultrasound enhancing agent through an IV if needed to better visualize your heart during the echo. This procedure takes approximately one hour.  There are no restrictions for this procedure.  This will take place at [address] [place], [place] [address] Phone: [phone]  Your cardiac CT will be scheduled at : [facility]  [facility] [address], [place], [place] [address] Phone: [phone]  Please follow these instructions carefully (unless otherwise directed):  An IV will be required for this test and Nitroglycerin  will be given.  Hold all erectile dysfunction medications at least 3 days (72 hrs) prior to test. (Ie viagra, cialis, sildenafil, tadalafil, etc)   On the Night Before the Test: Be sure to Drink plenty of water. Do not consume any  caffeinated/decaffeinated beverages or chocolate 12 hours prior to your test. Do not take any antihistamines 12 hours prior to your test. If the patient has contrast allergy: Patient will need a prescription for Prednisone  and very clear instructions (as follows): Prednisone  50 mg - take 13 hours prior to test Take another Prednisone  50 mg 7 hours prior to test Take another Prednisone  50 mg 1 hour prior to test Take Benadryl 50 mg 1 hour prior to test Patient must complete all four doses of above prophylactic medications. Patient will need a ride after test due to Benadryl.  On the Day of the Test: Drink plenty of water until 1 hour prior to the test. Do not eat any food 1 hour prior to test. You may take your regular medications prior to the test.  Take metoprolol  (Lopressor ) two hours prior to test. If you take Furosemide/Hydrochlorothiazide/Spironolactone/Chlorthalidone, please HOLD on the morning of the test. Patients who wear a continuous glucose monitor MUST remove the device prior to scanning. FEMALES- please wear underwire-free bra if available, avoid dresses & tight clothing       After the Test: Drink plenty of water. After receiving IV contrast, you may experience a mild flushed feeling. This is normal. On occasion, you may experience a mild rash up to 24 hours after the test. This is not dangerous. If this occurs, you can take Benadryl 25 mg, Zyrtec , Claritin, or Allegra and increase your fluid intake. (Patients taking Tikosyn should avoid Benadryl, and may take Zyrtec , Claritin, or Allegra) If you experience trouble breathing, this can be  serious. If it is severe call 911 IMMEDIATELY. If it is mild, please call our office.  We will call to schedule your test 2-4 weeks out understanding that some insurance companies will need an authorization prior to the service being performed.   For more information and frequently asked questions, please visit our website :  [email or web address]  For non-scheduling related questions, please contact the cardiac imaging nurse navigator should you have any questions/concerns: Cardiac Imaging Nurse Navigators Direct Office Dial: [phone]   For scheduling needs, including cancellations and rescheduling, please call [place], [phone].    Please note: We ask at that you not bring children with you during ultrasound (echo/ vascular) testing. Due to room size and safety concerns, children are not allowed in the ultrasound rooms during exams. Our front office staff cannot provide observation of children in our lobby area while testing is being conducted. An adult accompanying a patient to their appointment will only be allowed in the ultrasound room at the discretion of the ultrasound technician under special circumstances. We apologize for any inconvenience.   Follow-Up: At [facility], you and your health needs are our priority.  As part of our continuing mission to provide you with exceptional heart care, our providers are all part of one team.  This team includes your primary Cardiologist (physician) and Advanced Practice Providers or APPs (Physician Assistants and Nurse Practitioners) who all work together to provide you with the care you need, when you need it.  Your next appointment:   6 - 8 week(s)  Provider:   Barnie Hila, NP    We recommend signing up for the patient portal called MyChart.  Sign up information is provided on this After Visit Summary.  MyChart is used to connect with patients for Virtual Visits (Telemedicine).  Patients are able to view lab/test results, encounter notes, upcoming appointments, etc.  Non-urgent messages can be sent to your provider as well.   To learn more about what you can do with MyChart, go to [email or web address].

## 2024-05-30 ENCOUNTER — Ambulatory Visit: Payer: Self-pay | Admitting: Student

## 2024-05-30 ENCOUNTER — Ambulatory Visit (HOSPITAL_COMMUNITY)
Admission: RE | Admit: 2024-05-30 | Discharge: 2024-05-30 | Discharge status: Home / Self Care | Source: Ambulatory Visit | Attending: Student

## 2024-05-30 DIAGNOSIS — I700 Atherosclerosis of aorta: Secondary | ICD-10-CM | POA: Diagnosis not present

## 2024-05-30 DIAGNOSIS — R072 Precordial pain: Secondary | ICD-10-CM | POA: Diagnosis present

## 2024-05-30 DIAGNOSIS — I2510 Atherosclerotic heart disease of native coronary artery without angina pectoris: Secondary | ICD-10-CM | POA: Diagnosis not present

## 2024-05-30 LAB — BASIC METABOLIC PANEL WITH GFR
BUN/Creatinine Ratio: 16 (ref 10–24)
BUN: 13 mg/dL (ref 8–27)
CO2: 20 mmol/L (ref 20–29)
Calcium: 9.4 mg/dL (ref 8.6–10.2)
Chloride: 101 mmol/L (ref 96–106)
Creatinine, Ser: 0.82 mg/dL (ref 0.76–1.27)
Glucose: 95 mg/dL (ref 70–99)
Potassium: 4.4 mmol/L (ref 3.5–5.2)
Sodium: 139 mmol/L (ref 134–144)
eGFR: 95 mL/min/1.73 (ref >59)

## 2024-05-30 MED ORDER — IOHEXOL 350 MG/ML SOLN
100.0000 mL | Freq: Once | INTRAVENOUS | Status: AC | PRN
  Administered 2024-05-30: 100 mL via INTRAVENOUS

## 2024-05-30 MED ORDER — NITROGLYCERIN 0.4 MG SL SUBL: 0.8000 mg | SUBLINGUAL_TABLET | Freq: Once | SUBLINGUAL | Status: DC

## 2024-05-30 NOTE — Progress Notes (Signed)
 Last read by Evalene DELENA Veda Velinda at 10:36AM on 05/30/2024.

## 2024-05-31 ENCOUNTER — Ambulatory Visit (HOSPITAL_COMMUNITY)
Admission: RE | Admit: 2024-05-31 | Discharge: 2024-05-31 | Disposition: A | Discharge status: Home / Self Care | Source: Ambulatory Visit | Attending: Cardiology | Admitting: Cardiology

## 2024-05-31 ENCOUNTER — Other Ambulatory Visit: Payer: Self-pay | Admitting: Cardiology

## 2024-05-31 DIAGNOSIS — I2510 Atherosclerotic heart disease of native coronary artery without angina pectoris: Secondary | ICD-10-CM | POA: Insufficient documentation

## 2024-05-31 DIAGNOSIS — R931 Abnormal findings on diagnostic imaging of heart and coronary circulation: Secondary | ICD-10-CM | POA: Diagnosis present

## 2024-05-31 DIAGNOSIS — R072 Precordial pain: Secondary | ICD-10-CM | POA: Diagnosis present

## 2024-05-31 DIAGNOSIS — Z01812 Encounter for preprocedural laboratory examination: Secondary | ICD-10-CM

## 2024-06-01 MED ORDER — ROSUVASTATIN CALCIUM 20 MG PO TABS: 20.0000 mg | ORAL_TABLET | Freq: Every day | ORAL | 3 refills | Status: DC

## 2024-06-01 NOTE — Addendum Note (Signed)
 Addended by: Shavell Nored A on: 06/01/2024 10:04 AM   Modules accepted: Orders

## 2024-06-01 NOTE — Progress Notes (Signed)
 Last read by Evalene DELENA Veda Velinda at 10:17AM on 06/01/2024.

## 2024-06-13 ENCOUNTER — Ambulatory Visit (HOSPITAL_COMMUNITY)
Admission: RE | Admit: 2024-06-13 | Discharge: 2024-06-13 | Disposition: A | Discharge status: Home / Self Care | Source: Ambulatory Visit | Attending: Student | Admitting: Student

## 2024-06-13 DIAGNOSIS — R0609 Other forms of dyspnea: Secondary | ICD-10-CM | POA: Diagnosis present

## 2024-06-13 LAB — ECHOCARDIOGRAM COMPLETE
Area-P 1/2: 3.99 cm2
MV M vel: 4.42 m/s
MV Peak grad: 78.1 mmHg
P 1/2 time: 521 ms
S' Lateral: 3.3 cm
Single Plane A2C EF: 66.9 %

## 2024-06-13 NOTE — Progress Notes (Signed)
 Echocardiogram 2D Echocardiogram has been performed.  Koleen KANDICE Popper, RDCS 06/13/2024, 10:04 AM

## 2024-06-14 NOTE — Progress Notes (Signed)
 Last read by Evalene DELENA Veda Velinda at 7:30AM on 06/14/2024.

## 2024-07-07 ENCOUNTER — Telehealth: Payer: Self-pay | Admitting: Emergency Medicine

## 2024-07-07 NOTE — Telephone Encounter (Signed)
 Called and left message for the patient to call back. Call back number provided.  Voicemail message confirmed wanting to reschedule for Friday of next week or Wednesday of the following week.

## 2024-07-10 ENCOUNTER — Ambulatory Visit: Admitting: Student

## 2024-07-11 NOTE — Progress Notes (Unsigned)
 Cardiology Clinic Note   Date: 07/14/2024 ID: Steven Cummings, DOB June 15, 1956, MRN [id number]  Primary Cardiologist:  None  Chief Complaint   Steven Cummings is a 68 y.o. male who presents to the clinic today for follow up after testing.   Patient Profile       Past medical history significant for: Nonobstructive CAD. Coronary CTA with FFR 05/30/2024: Coronary calcium  score of 323 (67th percentile).  Moderate stenosis due to calcified plaque ostial D1.  Minimal mixed plaque ostial distal left main.  Minimal mixed plaque proximal RCA.  Calcified plaque proximal to mid RCA.  FFR analysis showed no significant stenosis. Echo 06/13/2024: EF 55 to 60%.  No RWMA.  Normal diastolic parameters.  Normal global strain.  Normal RV size/function.  Normal PA pressure.  Trivial MR.  Mild AI.  No aortic stenosis. Complex regional pain syndrome right upper extremity. Hyperlipidemia. Lipid panel 01/18/2024: LDL 128, HDL 42, TG 121, total 192. Former tobacco use.    Patient was seen for new patient visit on 05/29/2024 to establish cardiac care.  He reported his brother had recently been diagnosed with bicuspid valve and was pending valve replacement.  It was strongly suggested that he also get evaluated.  He endorsed a family history of valve disease in his father who had a valve replacement and multiple uncles and cousins who died from MI before the age of 59.  He reported a history of having a light heart attack in 2003 for which he underwent heart catheterization which showed 40% stenosis in one of the arteries but nothing requiring PCI.  After MI he improved his diet, lost weight and started exercising.  He described being very active walking 15,000-20,000 steps most days and performing body weight exercises.  He reported mild dyspnea with heavier exertion and finds the longer he is active dyspnea will improve.     History of Present Illness    Today, patient is doing well. He has no cardiac  complaints today. He reports itching since starting rosuvastatin  and wonders if he can reduce his dose. Discussed results of coronary CTA and echo in detail. All questions answered.     ROS: All other systems reviewed and are otherwise negative except as noted in History of Present Illness.  EKGs/Labs Reviewed       EKG is not ordered today.   01/18/2024: ALT 21; AST 20 05/29/2024: BUN 13; Creatinine, Ser 0.82; Potassium 4.4; Sodium 139   01/18/2024: Hemoglobin 14.8; WBC 4.3    Physical Exam    VS:  BP (!) 110/58 (BP Location: Left Arm, Patient Position: Sitting, Cuff Size: Normal)   Pulse (!) 59   Ht 5' 11 (1.803 m)   Wt 175 lb 12.8 oz (79.7 kg)   SpO2 98%   BMI 24.52 kg/m  , BMI Body mass index is 24.52 kg/m.  GEN: Well nourished, well developed, in no acute distress. Neck: No JVD or carotid bruits. Cardiac:  RRR.  No murmur. No rubs or gallops.   Respiratory:  Respirations regular and unlabored. Clear to auscultation without rales, wheezing or rhonchi. GI: Soft, nontender, nondistended. Extremities: Radials/DP/PT 2+ and equal bilaterally. No clubbing or cyanosis. No edema.  Skin: Warm and dry, no rash. Neuro: Strength intact.  Assessment & Plan   Nonobstructive CAD Patient reports LHC in 2003 showing 40% stenosis in one of his arteries (no report available). He has a strong family history of premature CAD with several uncles and cousins passing away from MI  before age 53. His father and brother have valve disease and his brother is currently pending valve replacement.  Coronary CTA July 2025 demonstrated coronary calcium  score of 323 with moderate stenosis found to be insignificant with FFR. Echo showed normal LV/RV function, normal PA pressure, mild AI, trivial MR.  Patient is doing well with no complaints today.  - Continue aspirin and rosuvastatin  (see below).  Hyperlipidemia LDL 128 March 2025, not at goal. Patient reports developing itching since starting rosuvastatin   and wonders if his dose can  be reduced. He reports back in 2003 he was on atorvastatin but his LDL improved with diet and exercise and it was subsequently stopped.  - Decrease  rosuvastatin  to 5 mg daily. - Repeat lipid panel and LFTs in 1 month.  Disposition: Decrease rosuvastatin  to 5 mg daily. Lipid panel and LFTs in 1 month. Patient reports [facility] location is more convenient for him and he would like to transfer care there. Will have patient follow up in 1 year with Dr. Kate (he is DOD in [place] today).          Signed, Barnie HERO. Tonnie Stillman, DNP, NP-C

## 2024-07-14 ENCOUNTER — Ambulatory Visit: Attending: Student | Admitting: Student

## 2024-07-14 ENCOUNTER — Encounter: Payer: Self-pay | Admitting: Student

## 2024-07-14 VITALS — BP 110/58 | HR 59 | Ht 71.0 in | Wt 175.8 lb

## 2024-07-14 DIAGNOSIS — E785 Hyperlipidemia, unspecified: Secondary | ICD-10-CM | POA: Diagnosis present

## 2024-07-14 DIAGNOSIS — Z79899 Other long term (current) drug therapy: Secondary | ICD-10-CM | POA: Insufficient documentation

## 2024-07-14 DIAGNOSIS — I2510 Atherosclerotic heart disease of native coronary artery without angina pectoris: Secondary | ICD-10-CM | POA: Diagnosis not present

## 2024-07-14 MED ORDER — ROSUVASTATIN CALCIUM 5 MG PO TABS
5.0000 mg | ORAL_TABLET | Freq: Every day | ORAL | 3 refills | Status: AC
Start: 2024-07-14 — End: 2024-10-12

## 2024-07-14 NOTE — Patient Instructions (Signed)
 Medication Instructions:  Your physician recommends the following medication changes.  DECREASE: Rosuvastatin  to 5 mg once daily  *If you need a refill on your cardiac medications before your next appointment, please call your pharmacy*  Lab Work: Your provider would like for you to return in One Month to have the following labs drawn: Hepatic Function Panel, Lipid Panel.   Please go to [facility] [address], [place] [address] Or any [facility] location You do not need an appointment.  They are open from 8 am- 4:30 pm.  Lunch from 1:00 pm- 2:00 pm You DO need to be fasting.   If you have labs (blood work) drawn today and your tests are completely normal, you will receive your results only by: MyChart Message (if you have MyChart) OR A paper copy in the mail If you have any lab test that is abnormal or we need to change your treatment, we will call you to review the results.  Testing/Procedures: None ordered at this time   Follow-Up: At [facility], you and your health needs are our priority.  As part of our continuing mission to provide you with exceptional heart care, our providers are all part of one team.  This team includes your primary Cardiologist (physician) and Advanced Practice Providers or APPs (Physician Assistants and Nurse Practitioners) who all work together to provide you with the care you need, when you need it.  Your next appointment:   1 year(s)  Provider:   Dr. Kate    We recommend signing up for the patient portal called MyChart.  Sign up information is provided on this After Visit Summary.  MyChart is used to connect with patients for Virtual Visits (Telemedicine).  Patients are able to view lab/test results, encounter notes, upcoming appointments, etc.  Non-urgent messages can be sent to your provider as well.   To learn more about what you can do with MyChart, go to [email or web address].

## 2024-08-10 ENCOUNTER — Other Ambulatory Visit

## 2024-08-11 ENCOUNTER — Ambulatory Visit: Payer: Self-pay | Admitting: Student

## 2024-08-11 LAB — HEPATIC FUNCTION PANEL
ALT: 25 IU/L (ref 0–44)
AST: 23 IU/L (ref 0–40)
Albumin: 4.6 g/dL (ref 3.9–4.9)
Alkaline Phosphatase: 113 IU/L (ref 47–123)
Bilirubin Total: 0.8 mg/dL (ref 0.0–1.2)
Bilirubin, Direct: 0.29 mg/dL (ref 0.00–0.40)
Total Protein: 6.9 g/dL (ref 6.0–8.5)

## 2024-08-11 LAB — LIPID PANEL
Chol/HDL Ratio: 3.1 ratio (ref 0.0–5.0)
Cholesterol, Total: 119 mg/dL (ref 100–199)
HDL: 39 mg/dL — ABNORMAL LOW (ref >39)
LDL Chol Calc (NIH): 62 mg/dL (ref 0–99)
Triglycerides: 94 mg/dL (ref 0–149)
VLDL Cholesterol Cal: 18 mg/dL (ref 5–40)

## 2024-08-11 NOTE — Progress Notes (Signed)
 Last read by Evalene DELENA Veda Velinda at 7:47AM on 08/11/2024.

## 2025-01-18 ENCOUNTER — Encounter: Payer: Self-pay | Admitting: Family Medicine

## 2025-04-26 ENCOUNTER — Ambulatory Visit: Payer: Self-pay

## 2025-05-08 ENCOUNTER — Ambulatory Visit
